# Patient Record
Sex: Male | Born: 1965
Health system: Southern US, Community
[De-identification: ages and names within clinical notes are randomized; demographics above are authoritative.]

## PROBLEM LIST (undated history)

## (undated) DIAGNOSIS — S060XAA Concussion with loss of consciousness status unknown, initial encounter: Secondary | ICD-10-CM

## (undated) DIAGNOSIS — G459 Transient cerebral ischemic attack, unspecified: Secondary | ICD-10-CM

## (undated) DIAGNOSIS — M1990 Unspecified osteoarthritis, unspecified site: Secondary | ICD-10-CM

## (undated) DIAGNOSIS — Z87442 Personal history of urinary calculi: Secondary | ICD-10-CM

## (undated) DIAGNOSIS — F32A Depression, unspecified: Secondary | ICD-10-CM

## (undated) DIAGNOSIS — F419 Anxiety disorder, unspecified: Secondary | ICD-10-CM

## (undated) HISTORY — DX: Transient cerebral ischemic attack, unspecified: G45.9

## (undated) HISTORY — DX: Concussion with loss of consciousness status unknown, initial encounter: S06.0XAA

## (undated) HISTORY — PX: TONSILLECTOMY: SUR1361

---

## 2014-10-02 ENCOUNTER — Emergency Department (HOSPITAL_COMMUNITY)
Admission: EM | Admit: 2014-10-02 | Discharge: 2014-10-02 | Disposition: A | Discharge status: Home / Self Care | Payer: 59 | Source: Home / Self Care | Attending: Family Medicine | Admitting: Family Medicine

## 2014-10-02 ENCOUNTER — Emergency Department (INDEPENDENT_AMBULATORY_CARE_PROVIDER_SITE_OTHER): Payer: 59

## 2014-10-02 ENCOUNTER — Encounter (HOSPITAL_COMMUNITY): Payer: Self-pay | Admitting: Emergency Medicine

## 2014-10-02 DIAGNOSIS — S53031A Nursemaid's elbow, right elbow, initial encounter: Secondary | ICD-10-CM

## 2014-10-02 MED ORDER — HYDROCODONE-ACETAMINOPHEN 5-325 MG PO TABS: 1.0000 | ORAL_TABLET | Freq: Four times a day (QID) | ORAL | Status: DC | PRN

## 2014-10-02 NOTE — ED Notes (Signed)
48 year old male.  Presents to Urgent care ambulatory with his 57 year old daughter.  States that he fell fell approximately  8-10 feet off off roof at his house approximately 1 hour ago.  No LOC.   His only complaints are a bloody nose, a very tender right elbow, and soreness  To left shoulder.

## 2014-10-02 NOTE — Discharge Instructions (Signed)
Elbow Subluxation Elbow subluxation is a partial or minor dislocation of the elbow. This is the most common elbow injury in children up to age 47. This injury happens most often when the child's hand is pulled too hard or when the child is lifted or swung around. It has been called "Nurse Maid's Elbow" labeled after the action of lifting a child off the ground by pulling up on the child's arm. Immediately after such an injury, the child will usually be fussy, but the pain may not last for long. Until the dislocation is corrected, the child may hold the affected arm by their side and refuse to reach out or use it. Elbow x-rays are normal in this condition and are usually not needed to make the diagnosis. The treatment of elbow subluxation is a simple maneuver. It moves the head of the radius bone back to its normal position. The child is usually willing to use the arm within minutes after this procedure. Have your child rest the injured arm for the next 2 days. Use a sling if this makes your child feel better. Avoid pulling the affected arm again, especially over the next 2 weeks. Follow-up with your caregiver as recommended.  SEEK MEDICAL CARE IF:   Your child continues to protect the affected arm.  Lacks full motion of the elbow within 2-3 days.  Complains of persistent pain. Document Released: 01/10/2005 Document Revised: 02/25/2012 Document Reviewed: 03/23/2009 [facility] Patient Information 2015 [place], [place]. This information is not intended to replace advice given to you by your health care provider. Make sure you discuss any questions you have with your health care provider.

## 2014-10-02 NOTE — ED Provider Notes (Signed)
CSN: [id number]     Arrival date & time 10/02/14  1511 History   First MD Initiated Contact with Patient 10/02/14 1553     Chief Complaint  Patient presents with  . Fall   (Consider location/radiation/quality/duration/timing/severity/associated sxs/prior Treatment) HPI      48 year old male presents for evaluation of injury to his right elbow after a fall. Earlier today he slid off his roof and fell 8 feet to his porch. This occurred about 3 hours prior to arrival. He had some immediate pain in his face associated with a nosebleed. This has stopped with direct pressure and he no longer has any pain in his face, although he does have swelling to the right side of his nose. He is able to breathe through his nostrils without difficulty. He also has experienced pain most significantly in his right elbow but also in his right knee and left shoulder. He is able to move the right knee and left shoulder completely without any difficulty and is able to able he without difficulty. He is most concerned about the right elbow. He has severely limited range of motion and pain with any attempted range of motion. There is no swelling around the elbow as well. No numbness or tingling in any of the extremities. No headache, NVD, blurred or double vision, chest pain, difficulty breathing.  No past medical history on file. No past surgical history on file. No family history on file. History  Substance Use Topics  . Smoking status: Not on file  . Smokeless tobacco: Not on file  . Alcohol Use: Not on file    Review of Systems  Constitutional: Negative for fever, chills and fatigue.  HENT: Negative for sore throat.        See history of present illness regarding facial trauma  Eyes: Negative for visual disturbance.  Respiratory: Negative for cough, chest tightness and shortness of breath.   Cardiovascular: Negative for chest pain, palpitations and leg swelling.  Gastrointestinal: Negative for nausea, vomiting,  abdominal pain, diarrhea and constipation.  Genitourinary: Negative for dysuria, urgency, frequency and hematuria.  Musculoskeletal: Positive for arthralgias (see HPI) and back pain (chronic). Negative for myalgias, neck pain and neck stiffness.  Skin: Negative for rash.  Neurological: Negative for dizziness, speech difficulty, weakness, light-headedness, numbness and headaches.  All other systems reviewed and are negative.   Allergies  Review of patient's allergies indicates no known allergies.  Home Medications   Prior to Admission medications   Medication Sig Start Date End Date Taking? Authorizing Provider  FLUoxetine (PROZAC) 40 MG capsule Take 40 mg by mouth daily.   Yes Historical Provider, MD  testosterone (ANDROGEL) 50 MG/5GM (1%) GEL Place 5 g onto the skin daily.   Yes Historical Provider, MD  HYDROcodone-acetaminophen (NORCO) 5-325 MG per tablet Take 1-2 tablets by mouth every 6 (six) hours as needed for moderate pain. 10/02/14   Freeman Caldron Tadhg Eskew, PA-C   BP 135/89  Pulse 96  Temp(Src) 99.2 F (37.3 C) (Oral)  Resp 16  SpO2 97% Physical Exam  Nursing note and vitals reviewed. Constitutional: He is oriented to person, place, and time. He appears well-developed and well-nourished. No distress.  HENT:  Head: Normocephalic. Head is with abrasion (abrasion ofthe right cheek with slight swelling over the right side of nose).  Nose: No sinus tenderness. Right sinus exhibits no maxillary sinus tenderness and no frontal sinus tenderness. Left sinus exhibits no maxillary sinus tenderness and no frontal sinus tenderness.  Nares are patent  Eyes: Conjunctivae and EOM are normal. Pupils are equal, round, and reactive to light.  No orbital tenderness  Neck: Full passive range of motion without pain. Neck supple.  Pulmonary/Chest: Effort normal. No respiratory distress.  Musculoskeletal:       Left shoulder: He exhibits tenderness (minimal tenderness around the junction of the sternum  and the third rib). He exhibits no swelling and no deformity.       Right elbow: He exhibits decreased range of motion (Decreased supination , extension, and flexion). He exhibits no swelling, no effusion, no deformity and no laceration. Tenderness found. Radial head tenderness noted. No medial epicondyle, no lateral epicondyle and no olecranon process tenderness noted.       Right knee: He exhibits erythema (Abrasion on the anterior portion of the right knee). He exhibits normal range of motion, no swelling, no effusion and no deformity. No tenderness found.       Cervical back: Normal.       Thoracic back: Normal.       Lumbar back: Normal.  Neurological: He is alert and oriented to person, place, and time. He has normal strength. No cranial nerve deficit or sensory deficit. He exhibits normal muscle tone. Coordination and gait normal. GCS eye subscore is 4. GCS verbal subscore is 5. GCS motor subscore is 6.  Skin: Skin is warm and dry. No rash noted. He is not diaphoretic.  Psychiatric: He has a normal mood and affect. Judgment normal.    ED Course  Reduction of dislocation Date/Time: 10/02/2014 5:35 PM Performed by: Allena Katz, H Authorized by: Ihor Gully D Consent: Verbal consent obtained. Risks and benefits: risks, benefits and alternatives were discussed Consent given by: patient Patient identity confirmed: verbally with patient Time out: Immediately prior to procedure a "time out" was called to verify the correct patient, procedure, equipment, support staff and site/side marked as required. Patient sedated: no Patient tolerance: Patient tolerated the procedure well with no immediate complications. Comments: Radial head subluxation, reduced with traction/supination and massage the radial head   (including critical care time) Labs Review Labs Reviewed - No data to display  Imaging Review Dg Elbow Complete Right  10/02/2014   CLINICAL DATA:  Status post fall from roof at  13:30 hr today. Right elbow pain.  EXAM: RIGHT ELBOW - COMPLETE 3+ VIEW  COMPARISON:  None.  FINDINGS: The patient refused to complete the exam. No fracture, dislocation or joint effusion is identified on images provided.  IMPRESSION: No acute abnormality.   Electronically Signed   By: Inge Rise M.D.   On: 10/02/2014 17:01     MDM   1. Nursemaid's elbow, right, initial encounter    Reduced successfully with near full resolution of symptoms. Ice, anti-inflammatory, Norco when necessary. Followup with ortho this week    New Prescriptions   HYDROCODONE-ACETAMINOPHEN (NORCO) 5-325 MG PER TABLET    Take 1-2 tablets by mouth every 6 (six) hours as needed for moderate pain.       Liam Graham, PA-C 10/02/14 [place] Annebelle Bostic, PA-C 10/03/14 [phone]

## 2014-10-06 NOTE — ED Provider Notes (Signed)
Medical screening examination/treatment/procedure(s) were performed by resident physician or non-physician practitioner and as supervising physician I was immediately available for consultation/collaboration.   Pauline Good MD.   Billy Fischer, MD 10/06/14 2030

## 2016-01-03 MED FILL — FLUoxetine HCL 20 MG CAPS: 20 | 90 days supply | Qty: 90 | Fill #0

## 2016-01-03 MED FILL — TRAMADOL-ACETAMINOPHN 37.5-: 37.5-325 | 45 days supply | Qty: 90 | Fill #0

## 2016-01-03 MED FILL — MELOXICAM 15 MG TABLET: 15 | 90 days supply | Qty: 90 | Fill #2

## 2016-01-04 MED FILL — ANDROGEL 1.62% GEL PUMP: 20.25 MG/AC | 84 days supply | Qty: 525 | Fill #0

## 2016-02-06 DIAGNOSIS — J40 Bronchitis, not specified as acute or chronic: Secondary | ICD-10-CM | POA: Diagnosis not present

## 2016-02-06 DIAGNOSIS — J329 Chronic sinusitis, unspecified: Secondary | ICD-10-CM | POA: Diagnosis not present

## 2016-02-21 DIAGNOSIS — R05 Cough: Secondary | ICD-10-CM | POA: Diagnosis not present

## 2016-02-21 DIAGNOSIS — J3489 Other specified disorders of nose and nasal sinuses: Secondary | ICD-10-CM | POA: Diagnosis not present

## 2016-02-21 MED FILL — BENZONATATE 200 MG CAPSULE: 200 | 14 days supply | Qty: 28 | Fill #0

## 2016-02-21 MED FILL — FLUTICASONE PROP 50 MCG SPR: 50 | 30 days supply | Qty: 16 | Fill #0

## 2016-02-28 DIAGNOSIS — H5203 Hypermetropia, bilateral: Secondary | ICD-10-CM | POA: Diagnosis not present

## 2016-04-10 DIAGNOSIS — D1801 Hemangioma of skin and subcutaneous tissue: Secondary | ICD-10-CM | POA: Diagnosis not present

## 2016-04-10 DIAGNOSIS — L578 Other skin changes due to chronic exposure to nonionizing radiation: Secondary | ICD-10-CM | POA: Diagnosis not present

## 2016-04-10 DIAGNOSIS — L821 Other seborrheic keratosis: Secondary | ICD-10-CM | POA: Diagnosis not present

## 2016-04-17 MED FILL — FLUoxetine HCL 20 MG CAPS: 20 | 90 days supply | Qty: 90 | Fill #1

## 2016-04-17 MED FILL — MELOXICAM 15 MG TABLET: 15 | 90 days supply | Qty: 90 | Fill #3

## 2016-04-17 MED FILL — TRAMADOL-ACETAMINOPHN 37.5-: 37.5-325 | 45 days supply | Qty: 90 | Fill #1

## 2016-04-24 MED FILL — ANDROGEL 1.62% GEL PUMP: 20.25 MG/AC | 84 days supply | Qty: 525 | Fill #1

## 2016-04-27 DIAGNOSIS — H8111 Benign paroxysmal vertigo, right ear: Secondary | ICD-10-CM | POA: Diagnosis not present

## 2016-07-02 DIAGNOSIS — S43421A Sprain of right rotator cuff capsule, initial encounter: Secondary | ICD-10-CM | POA: Diagnosis not present

## 2016-07-02 MED FILL — predniSONE 10 MG (48) TBPK: 10 | 12 days supply | Qty: 48 | Fill #0

## 2016-07-16 MED FILL — FLUoxetine HCL 20 MG CAPS: 20 | 90 days supply | Qty: 90 | Fill #2

## 2016-07-16 MED FILL — MELOXICAM 15 MG TABLET: 15 | 90 days supply | Qty: 90 | Fill #0

## 2016-08-06 DIAGNOSIS — E291 Testicular hypofunction: Secondary | ICD-10-CM | POA: Diagnosis not present

## 2016-08-06 DIAGNOSIS — E782 Mixed hyperlipidemia: Secondary | ICD-10-CM | POA: Diagnosis not present

## 2016-08-06 DIAGNOSIS — S43421A Sprain of right rotator cuff capsule, initial encounter: Secondary | ICD-10-CM | POA: Diagnosis not present

## 2016-08-06 DIAGNOSIS — F341 Dysthymic disorder: Secondary | ICD-10-CM | POA: Diagnosis not present

## 2016-08-06 MED FILL — TRAMADOL-ACETAMINOPHN 37.5-: 37.5-325 | 45 days supply | Qty: 90 | Fill #0

## 2016-08-06 MED FILL — CYCLOBENZAPRINE 5 MG TABLET: 5 | 10 days supply | Qty: 30 | Fill #0

## 2016-08-13 MED FILL — ANDROGEL 1.62% GEL PUMP: 20.25 MG/AC | 84 days supply | Qty: 525 | Fill #0

## 2016-10-10 MED FILL — CYCLOBENZAPRINE 5 MG TABLET: 5 | 10 days supply | Qty: 30 | Fill #1

## 2016-10-10 MED FILL — FLUoxetine HCL 20 MG CAPS: 20 | 90 days supply | Qty: 90 | Fill #0

## 2016-10-10 MED FILL — MELOXICAM 15 MG TABLET: 15 | 90 days supply | Qty: 90 | Fill #1

## 2016-10-10 MED FILL — TRAMADOL-ACETAMINOPHN 37.5-: 37.5-325 | 45 days supply | Qty: 90 | Fill #1

## 2016-10-15 DIAGNOSIS — S43421A Sprain of right rotator cuff capsule, initial encounter: Secondary | ICD-10-CM | POA: Diagnosis not present

## 2016-10-15 MED FILL — PENNSAID 2% PUMP: 2 | 30 days supply | Qty: 112 | Fill #0

## 2016-11-20 DIAGNOSIS — Z0000 Encounter for general adult medical examination without abnormal findings: Secondary | ICD-10-CM | POA: Diagnosis not present

## 2016-11-20 MED FILL — PENNSAID 2% PUMP: 2 | 90 days supply | Qty: 224 | Fill #0

## 2016-11-20 MED FILL — CEFDINIR 300 MG CAPSULE: 300 | 7 days supply | Qty: 14 | Fill #0

## 2016-12-20 MED FILL — ANDROGEL 1.62% GEL PUMP: 20.25 MG/AC | 84 days supply | Qty: 525 | Fill #1

## 2016-12-21 DIAGNOSIS — J189 Pneumonia, unspecified organism: Secondary | ICD-10-CM | POA: Diagnosis not present

## 2017-01-21 MED FILL — FLUoxetine HCL 20 MG CAPS: 20 | 90 days supply | Qty: 90 | Fill #1

## 2017-01-29 DIAGNOSIS — M7581 Other shoulder lesions, right shoulder: Secondary | ICD-10-CM | POA: Diagnosis not present

## 2017-01-29 MED FILL — MELOXICAM 15 MG TABLET: 15 | 90 days supply | Qty: 90 | Fill #0

## 2017-01-29 MED FILL — TRAMADOL-ACETAMINOPHN 37.5-: 37.5-325 | 45 days supply | Qty: 90 | Fill #0

## 2017-04-22 MED FILL — TRAMADOL-ACETAMINOPHN 37.5-: 37.5-325 | 45 days supply | Qty: 90 | Fill #1

## 2017-04-22 MED FILL — MELOXICAM 15 MG TABLET: 15 | 90 days supply | Qty: 90 | Fill #1

## 2017-04-22 MED FILL — FLUoxetine HCL 20 MG CAPS: 20 | 90 days supply | Qty: 90 | Fill #2

## 2017-04-25 MED FILL — DICLOFENAC SODIUM 1% GEL: 1 | 12 days supply | Qty: 200 | Fill #0

## 2017-04-26 MED FILL — ANDROGEL 1.62% GEL PUMP: 20.25 MG/AC | 84 days supply | Qty: 525 | Fill #0

## 2017-05-07 DIAGNOSIS — M7581 Other shoulder lesions, right shoulder: Secondary | ICD-10-CM | POA: Diagnosis not present

## 2017-05-07 DIAGNOSIS — L089 Local infection of the skin and subcutaneous tissue, unspecified: Secondary | ICD-10-CM | POA: Diagnosis not present

## 2017-05-07 DIAGNOSIS — Z1389 Encounter for screening for other disorder: Secondary | ICD-10-CM | POA: Diagnosis not present

## 2017-05-07 MED FILL — DICLOFENAC SODIUM 1% GEL: 1 | 87 days supply | Qty: 1400 | Fill #0

## 2017-05-09 MED FILL — MUPIROCIN 2% OINTMENT: 2 | 20 days supply | Qty: 22 | Fill #0

## 2017-06-25 DIAGNOSIS — Z6831 Body mass index (BMI) 31.0-31.9, adult: Secondary | ICD-10-CM | POA: Diagnosis not present

## 2017-06-25 DIAGNOSIS — L089 Local infection of the skin and subcutaneous tissue, unspecified: Secondary | ICD-10-CM | POA: Diagnosis not present

## 2017-06-25 DIAGNOSIS — M25571 Pain in right ankle and joints of right foot: Secondary | ICD-10-CM | POA: Diagnosis not present

## 2017-06-25 MED FILL — CEPHALEXIN 500 MG CAPSULE: 500 | 5 days supply | Qty: 10 | Fill #0

## 2017-06-25 MED FILL — MUPIROCIN 2% OINTMENT: 2 | 20 days supply | Qty: 22 | Fill #1

## 2017-06-25 MED FILL — TRAMADOL-ACETAMINOPHN 37.5-: 37.5-325 | 45 days supply | Qty: 90 | Fill #0

## 2017-07-22 MED FILL — ANDROGEL 1.62% GEL PUMP: 20.25 MG/AC | 84 days supply | Qty: 525 | Fill #1

## 2017-07-22 MED FILL — MELOXICAM 15 MG TABLET: 15 | 90 days supply | Qty: 90 | Fill #2

## 2017-07-22 MED FILL — MUPIROCIN 2% OINTMENT: 2 | 20 days supply | Qty: 22 | Fill #2

## 2017-07-22 MED FILL — FLUoxetine HCL 20 MG CAPS: 20 | 90 days supply | Qty: 90 | Fill #0

## 2017-08-05 DIAGNOSIS — H524 Presbyopia: Secondary | ICD-10-CM | POA: Diagnosis not present

## 2017-08-05 DIAGNOSIS — H5203 Hypermetropia, bilateral: Secondary | ICD-10-CM | POA: Diagnosis not present

## 2017-08-15 ENCOUNTER — Other Ambulatory Visit (HOSPITAL_COMMUNITY): Payer: Self-pay | Admitting: Family Medicine

## 2017-08-15 DIAGNOSIS — M7581 Other shoulder lesions, right shoulder: Principal | ICD-10-CM

## 2017-08-15 DIAGNOSIS — M778 Other enthesopathies, not elsewhere classified: Secondary | ICD-10-CM

## 2017-08-26 ENCOUNTER — Ambulatory Visit (HOSPITAL_COMMUNITY)
Admission: RE | Admit: 2017-08-26 | Discharge: 2017-08-26 | Disposition: A | Discharge status: Home / Self Care | Payer: 59 | Source: Ambulatory Visit | Attending: Family Medicine | Admitting: Family Medicine

## 2017-08-26 ENCOUNTER — Encounter (HOSPITAL_COMMUNITY): Payer: Self-pay

## 2017-08-26 DIAGNOSIS — M7581 Other shoulder lesions, right shoulder: Principal | ICD-10-CM

## 2017-08-26 DIAGNOSIS — M778 Other enthesopathies, not elsewhere classified: Secondary | ICD-10-CM

## 2017-09-05 MED FILL — PERMETHRIN 5% CREAM: 5 | 1 days supply | Qty: 60 | Fill #0

## 2017-09-23 DIAGNOSIS — Z23 Encounter for immunization: Secondary | ICD-10-CM | POA: Diagnosis not present

## 2017-10-21 MED FILL — TRAMADOL-ACETAMINOPHN 37.5-: 37.5-325 | 45 days supply | Qty: 90 | Fill #1

## 2017-10-21 MED FILL — MELOXICAM 15 MG TABLET: 15 | 90 days supply | Qty: 90 | Fill #0

## 2017-10-21 MED FILL — FLUoxetine HCL 20 MG CAPS: 20 | 90 days supply | Qty: 90 | Fill #1

## 2017-10-30 MED FILL — TESTOSTERONE 20.25 MG/ACT (: 20.25 MG/AC | 90 days supply | Qty: 450 | Fill #0

## 2017-11-05 DIAGNOSIS — E291 Testicular hypofunction: Secondary | ICD-10-CM | POA: Diagnosis not present

## 2017-11-05 DIAGNOSIS — Z6832 Body mass index (BMI) 32.0-32.9, adult: Secondary | ICD-10-CM | POA: Diagnosis not present

## 2017-11-05 DIAGNOSIS — M7581 Other shoulder lesions, right shoulder: Secondary | ICD-10-CM | POA: Diagnosis not present

## 2017-12-17 HISTORY — PX: SHOULDER ARTHROSCOPY W/ ROTATOR CUFF REPAIR: SHX2400

## 2018-01-20 MED FILL — MELOXICAM 15 MG TABLET: 15 | 90 days supply | Qty: 90 | Fill #1

## 2018-01-20 MED FILL — FLUoxetine HCL 20 MG CAPS: 20 | 90 days supply | Qty: 90 | Fill #2

## 2018-01-22 MED FILL — TRAMADOL-ACETAMINOPHN 37.5-: 37.5-325 | 45 days supply | Qty: 90 | Fill #0

## 2018-02-10 DIAGNOSIS — M67911 Unspecified disorder of synovium and tendon, right shoulder: Secondary | ICD-10-CM | POA: Diagnosis not present

## 2018-02-10 DIAGNOSIS — M67912 Unspecified disorder of synovium and tendon, left shoulder: Secondary | ICD-10-CM | POA: Diagnosis not present

## 2018-02-10 MED FILL — diazePAM 5 MG TABS: 5 | 2 days supply | Qty: 2 | Fill #0

## 2018-02-11 ENCOUNTER — Other Ambulatory Visit: Payer: Self-pay | Admitting: Orthopedic Surgery

## 2018-02-11 DIAGNOSIS — L820 Inflamed seborrheic keratosis: Secondary | ICD-10-CM | POA: Diagnosis not present

## 2018-02-11 DIAGNOSIS — M67911 Unspecified disorder of synovium and tendon, right shoulder: Secondary | ICD-10-CM

## 2018-02-11 DIAGNOSIS — M7581 Other shoulder lesions, right shoulder: Secondary | ICD-10-CM | POA: Diagnosis not present

## 2018-02-11 DIAGNOSIS — E291 Testicular hypofunction: Secondary | ICD-10-CM | POA: Diagnosis not present

## 2018-02-11 DIAGNOSIS — Z79899 Other long term (current) drug therapy: Secondary | ICD-10-CM | POA: Diagnosis not present

## 2018-02-11 DIAGNOSIS — M67912 Unspecified disorder of synovium and tendon, left shoulder: Principal | ICD-10-CM

## 2018-02-11 DIAGNOSIS — Z125 Encounter for screening for malignant neoplasm of prostate: Secondary | ICD-10-CM | POA: Diagnosis not present

## 2018-02-11 MED FILL — TAMSULOSIN HCL 0.4 MG CAP: 0.4 | 90 days supply | Qty: 90 | Fill #0

## 2018-02-11 MED FILL — CELECOXIB 200 MG CAPSULE: 200 | 90 days supply | Qty: 180 | Fill #0

## 2018-02-24 ENCOUNTER — Ambulatory Visit
Admission: RE | Admit: 2018-02-24 | Discharge: 2018-02-24 | Disposition: A | Discharge status: Home / Self Care | Payer: 59 | Source: Ambulatory Visit | Attending: Orthopedic Surgery | Admitting: Orthopedic Surgery

## 2018-02-24 ENCOUNTER — Other Ambulatory Visit: Payer: Self-pay | Admitting: Diagnostic Radiology

## 2018-02-24 DIAGNOSIS — M67912 Unspecified disorder of synovium and tendon, left shoulder: Principal | ICD-10-CM

## 2018-02-24 DIAGNOSIS — S0550XA Penetrating wound with foreign body of unspecified eyeball, initial encounter: Secondary | ICD-10-CM

## 2018-02-24 DIAGNOSIS — M67911 Unspecified disorder of synovium and tendon, right shoulder: Secondary | ICD-10-CM

## 2018-02-24 DIAGNOSIS — M19011 Primary osteoarthritis, right shoulder: Secondary | ICD-10-CM | POA: Diagnosis not present

## 2018-02-24 MED FILL — TESTOSTERONE 12.5 MG/1.25 G: 12.5 MG/ACT | 72 days supply | Qty: 450 | Fill #0

## 2018-03-03 DIAGNOSIS — M75111 Incomplete rotator cuff tear or rupture of right shoulder, not specified as traumatic: Secondary | ICD-10-CM | POA: Diagnosis not present

## 2018-04-01 DIAGNOSIS — M7542 Impingement syndrome of left shoulder: Secondary | ICD-10-CM | POA: Diagnosis not present

## 2018-04-01 DIAGNOSIS — J029 Acute pharyngitis, unspecified: Secondary | ICD-10-CM | POA: Diagnosis not present

## 2018-04-01 DIAGNOSIS — M7712 Lateral epicondylitis, left elbow: Secondary | ICD-10-CM | POA: Diagnosis not present

## 2018-04-17 MED FILL — FLUoxetine HCL 20 MG CAPS: 20 | 90 days supply | Qty: 90 | Fill #0

## 2018-04-17 MED FILL — TRAMADOL-ACETAMINOPHN 37.5-: 37.5-325 | 45 days supply | Qty: 90 | Fill #1

## 2018-05-14 MED FILL — CELECOXIB 200 MG CAPSULE: 200 | 90 days supply | Qty: 180 | Fill #1

## 2018-05-15 MED FILL — TESTOSTERONE 20.25 MG/ACT (: 20.25 MG/AC | 84 days supply | Qty: 525 | Fill #0

## 2018-05-19 ENCOUNTER — Ambulatory Visit
Admission: RE | Admit: 2018-05-19 | Discharge: 2018-05-19 | Disposition: A | Discharge status: Home / Self Care | Payer: 59 | Source: Ambulatory Visit | Attending: Orthopedic Surgery | Admitting: Orthopedic Surgery

## 2018-05-19 ENCOUNTER — Other Ambulatory Visit: Payer: Self-pay | Admitting: Orthopedic Surgery

## 2018-05-19 DIAGNOSIS — M75100 Unspecified rotator cuff tear or rupture of unspecified shoulder, not specified as traumatic: Secondary | ICD-10-CM

## 2018-05-19 DIAGNOSIS — M67912 Unspecified disorder of synovium and tendon, left shoulder: Secondary | ICD-10-CM

## 2018-05-19 DIAGNOSIS — M25512 Pain in left shoulder: Secondary | ICD-10-CM | POA: Diagnosis not present

## 2018-05-19 DIAGNOSIS — M545 Low back pain: Secondary | ICD-10-CM | POA: Diagnosis not present

## 2018-05-20 ENCOUNTER — Other Ambulatory Visit: Payer: Self-pay | Admitting: Orthopedic Surgery

## 2018-05-20 DIAGNOSIS — M67912 Unspecified disorder of synovium and tendon, left shoulder: Secondary | ICD-10-CM

## 2018-05-21 ENCOUNTER — Other Ambulatory Visit: Payer: 59

## 2018-06-09 DIAGNOSIS — Z1339 Encounter for screening examination for other mental health and behavioral disorders: Secondary | ICD-10-CM | POA: Diagnosis not present

## 2018-06-09 DIAGNOSIS — Z0000 Encounter for general adult medical examination without abnormal findings: Secondary | ICD-10-CM | POA: Diagnosis not present

## 2018-06-09 DIAGNOSIS — Z1331 Encounter for screening for depression: Secondary | ICD-10-CM | POA: Diagnosis not present

## 2018-06-09 DIAGNOSIS — Z6831 Body mass index (BMI) 31.0-31.9, adult: Secondary | ICD-10-CM | POA: Diagnosis not present

## 2018-06-09 MED FILL — SILDENAFIL CITRATE 100 MG T: 100 | 30 days supply | Qty: 6 | Fill #0

## 2018-06-10 DIAGNOSIS — M7542 Impingement syndrome of left shoulder: Secondary | ICD-10-CM | POA: Diagnosis not present

## 2018-06-10 DIAGNOSIS — G8918 Other acute postprocedural pain: Secondary | ICD-10-CM | POA: Diagnosis not present

## 2018-06-10 DIAGNOSIS — M75112 Incomplete rotator cuff tear or rupture of left shoulder, not specified as traumatic: Secondary | ICD-10-CM | POA: Diagnosis not present

## 2018-06-10 MED FILL — HYDROCODON-APAP 5-325: 5-325 | 3 days supply | Qty: 30 | Fill #0

## 2018-06-10 MED FILL — METHOCARBAMOL 500 MG TABLET: 500 | 10 days supply | Qty: 30 | Fill #0

## 2018-06-23 DIAGNOSIS — Z9889 Other specified postprocedural states: Secondary | ICD-10-CM | POA: Diagnosis not present

## 2018-06-26 ENCOUNTER — Other Ambulatory Visit: Payer: Self-pay

## 2018-06-26 ENCOUNTER — Ambulatory Visit: Payer: 59 | Attending: Orthopedic Surgery | Admitting: Physical Therapy

## 2018-06-26 ENCOUNTER — Encounter: Payer: Self-pay | Admitting: Physical Therapy

## 2018-06-26 DIAGNOSIS — R293 Abnormal posture: Secondary | ICD-10-CM | POA: Diagnosis not present

## 2018-06-26 DIAGNOSIS — M25512 Pain in left shoulder: Secondary | ICD-10-CM | POA: Insufficient documentation

## 2018-06-26 DIAGNOSIS — M25612 Stiffness of left shoulder, not elsewhere classified: Secondary | ICD-10-CM | POA: Diagnosis not present

## 2018-06-26 NOTE — Therapy (Signed)
[place], [place], [address] Phone: [phone]   Fax:  [phone]  Physical Therapy Evaluation  Patient Details  Name: Matthew Mooney. MRN: [id number] Date of Birth: [birth year] Referring Provider: Isabella Stalling    Encounter Date: 06/26/2018  PT End of Session - 06/26/18 1207    Visit Number  1    Number of Visits  16    Date for PT Re-Evaluation  08/21/18    Authorization Type  [facility]    PT Start Time  0931    PT Stop Time  1015    PT Time Calculation (min)  44 min    Activity Tolerance  Patient tolerated treatment well    Behavior During Therapy  [facility] for tasks assessed/performed       History reviewed. No pertinent past medical history.  History reviewed. No pertinent surgical history.  There were no vitals filed for this visit.   Subjective Assessment - 06/26/18 0940    Subjective  Had surgery on 06-10-18.  for RTC repair. Pt reports 99% tear in left shoulder.  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  Yes    Pain Score  2     Pain Orientation  Left    Pain Descriptors / Indicators  Aching;Sore    Pain Type  Surgical pain    Pain Onset  1 to 4 weeks ago    Aggravating Factors   raising arm over 90 degrees.      Pain Relieving Factors  ice, medication,           [facility] PT Assessment - 06/26/18 0940      Assessment   Medical Diagnosis  rotator cuff repair  left supraspinatus    Referring Provider  Isabella Stalling.     Onset Date/Surgical Date  06/10/18 RTC repair    Hand Dominance  Left    Next MD Visit  5 weeks    Prior Therapy  none      Precautions   Precautions  Shoulder    Type of Shoulder  Precautions  PROM protocol flex to 90, ER to 20 IR to abdomen       Restrictions   Weight Bearing Restrictions  No      Balance Screen   Has the patient fallen in the past 6 months  No    Has the patient had a decrease in activity level because of a fear of falling?   No    Is the patient reluctant to leave their home because of a fear of falling?   No      Prior Function   Level of Independence  Independent    Vocation  Full time employment    Energy manager      Cognition   Overall Cognitive Status  Within Functional Limits for tasks assessed      Observation/Other Assessments   Focus on Therapeutic Outcomes (FOTO)   FOTO intake 56% limitation 44%  predicted 27%      Sensation   Light Touch  Appears Intact  Hot/Cold  Appears Intact    Proprioception  Appears Intact      Posture/Postural Control   Posture/Postural Control  Postural limitations    Postural Limitations  Rounded Shoulders;Forward head      ROM / Strength   AROM / PROM / Strength  AROM;Strength      AROM   Overall AROM   Deficits    Right Shoulder Flexion  149 Degrees    Right Shoulder ABduction  144 Degrees    Right Shoulder Internal Rotation  60 Degrees    Right Shoulder External Rotation  80 Degrees    Left Shoulder Extension  15 Degrees    Left Shoulder Flexion  100 Degrees Pt comes to clinic lifting arm to 90/ ordersare PROM    Left Shoulder ABduction  75 Degrees    Left Shoulder Internal Rotation  40 Degrees    Left Shoulder External Rotation  20 Degrees      Strength   Overall Strength  Deficits    Right Shoulder Flexion  5/5    Right Shoulder Extension  5/5    Right Shoulder ABduction  5/5    Right Shoulder Internal Rotation  5/5    Right Shoulder External Rotation  5/5    Left Shoulder Flexion  3-/5    Left Shoulder Extension  3-/5    Left Shoulder ABduction  3-/5    Left Shoulder Internal Rotation  3-/5    Left Shoulder External Rotation  3-/5      Palpation    Palpation comment  tenderness over supraspinatus fossa and over arthroscopy sites                Objective measurements completed on examination: See above findings.      [facility] Adult PT Treatment/Exercise - 06/26/18 0001      Self-Care   Self-Care  Heat/Ice Application;Other Self-Care Comments    Heat/Ice Application  cryotherapy    Other Self-Care Comments   care of rotator cuff and imrportance of sticking with protocol  initial sitting and standing posture and use of towel at night for bed and positioning for comfort      Shoulder Exercises: Seated   Other Seated Exercises  table flexion, x 10 and elbow resting on table for elbow flexion x 10 and ball squeeze x 10      Shoulder Exercises: Standing   Other Standing Exercises  pendulum forward, side to side and clockwise and counter clockwise             PT Education - 06/26/18 1015    Education Details  POC Explanation of findings    Person(s) Educated  Patient    Methods  Explanation;Demonstration;Tactile cues;Verbal cues;Handout    Comprehension  Verbal cues required;Returned demonstration;Verbalized understanding       PT Short Term Goals - 06/26/18 1201      PT SHORT TERM GOAL #1   Title  "Independent with initial HEP    Time  4    Period  Weeks    Status  New    Target Date  07/24/18      PT SHORT TERM GOAL #2   Title  "Demonstrate and verbalize understanding of condition management including RICE, positioning, HEP.     Time  4    Period  Weeks    Status  New    Target Date  07/24/18      PT SHORT TERM GOAL #3   Title  Pt will be  able to perform AROM as per protocol within pain freee range    Time  4    Period  Weeks    Target Date  07/24/18      PT SHORT TERM GOAL #4   Title  "Demonstrate understanding of proper sitting posture, body mechanics, work ergonomics, and be more conscious of position and posture throughout the day.     Time  4    Period  Weeks    Status  New    Target Date   07/24/18        PT Long Term Goals - 06/26/18 1203      PT LONG TERM GOAL #1   Title  "Pt will be independent with advanced HEP.     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     Time  8    Period  Weeks    Status  New    Target Date  08/21/18      PT LONG TERM GOAL #4   Title  Left  shoulder IR and ER will return to [facility] to return to pain-free ADLs such as dressing and grooming.     Time  8    Period  Weeks    Status  New    Target Date  08/21/18      PT LONG TERM GOAL #5   Title  "FOTO will improve from  44% limtation   to  27 % limitation   indicating improved functional mobility .     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 . Pt presents with impairments including mild pain, limited ROM, weakness, and as a result, pt is limited with ADLs and functional activities. Pt would benefit from skilled outpatient PT services for 2 times a week for 8 weeks to progress toward pain-free PLOF. and return to work as a Art gallery manager    Clinical Presentation  Stable    Clinical Decision Making  Low    Rehab Potential  Excellent    PT Frequency  2x / week    PT Duration  8 weeks    PT Treatment/Interventions  Dry needling;Cryotherapy;Electrical Stimulation;Iontophoresis 4mg /ml Dexamethasone;Moist Heat;Ultrasound;Therapeutic exercise;Therapeutic  activities;Neuromuscular re-education;Patient/family education;Manual techniques;Passive range of motion;Taping    PT Next Visit Plan  Review HEP pendulum, perform PROM as per protocol chandler in media section/protocols.  RTC 06-10-18    PT Home Exercise Plan  pendulum, PROM flex to 90, ER 20 IR to abdomen elbow flex, ball squeeze  Consulted and Agree with Plan of Care  Patient       Patient will benefit from skilled therapeutic intervention in order to improve the following deficits and impairments:  Pain, Postural dysfunction, Improper body mechanics, Impaired UE functional use, Decreased strength, Decreased range of motion, Impaired flexibility  Visit Diagnosis: Acute pain of left shoulder  Abnormal posture  Stiffness of left shoulder, not elsewhere classified     Problem List There are no active problems to display for this patient.  Voncille Lo, PT Certified Exercise Expert for the Aging Adult  06/26/18 12:19 PM Phone: [phone] Fax: [facility] [address] [place], [place], [address] Phone: [phone]   Fax:  [phone]  Name: Orvie Caradine. MRN: [id number] Date of Birth: 07/21/1966

## 2018-06-26 NOTE — Patient Instructions (Addendum)
ROM: Pendulum (Circular)  Let right arm move in circle clockwise, then counterclockwise, by rocking body weight in circular pattern. Circle _10___ times each direction per set. Do _3___ sessions per day.  Pendulum Side to Side  Bend forward 90 at waist, leaning on table for support. Rock body from side to side and let arm swing freely. Repeat _10___ times. Do __3__ sessions per day.  Finger Flexors  Keeping right fingertips straight, press putty toward base of palm. Repeat __20__ times. Do __3__ sessions per day. Activity: Squeeze flour sifter, plastic squeeze bottles, [place] baster, juice from fruit.*  AROM: Elbow Flexion / Extension  With left hand palm up, gently bend elbow as far as possible. Then straighten arm as far as possible. Repeat __10__ times per set. Do __3__ sessions per day.  SHOULDER: Flexion On Table  Place hands on table, elbows straight. Move hips away from body. Press hands down into table. Hold _3__ seconds. _10__ reps per set, __3_ sets per day. Posture - Sitting   Sit upright, head facing forward. Try using a roll to support lower back. Keep shoulders relaxed, and avoid rounded back. Keep hips level with knees. Avoid crossing legs for long periods.  Sit on sit bones not tail bone.  Remember ribs lifted up and chin down. Not military  Copyright  VHI. All rights reserved.   Cryotherapy  ICE 20 mins at most, 3 times a day following exercises.   Cryotherapy means treatment with cold. Ice or gel packs can be used to reduce both pain and swelling. Ice is the most helpful within the first 24 to 48 hours after an injury or flare-up from overusing a muscle or joint. Sprains, strains, spasms, burning pain, shooting pain, and aches can all be eased with ice. Ice can also be used when recovering from surgery. Ice is effective, has very few side effects, and is safe for most people to use. PRECAUTIONS  Ice is not a safe treatment option for people with:  Raynaud  phenomenon. This is a condition affecting small blood vessels in the extremities. Exposure to cold may cause your problems to return.  Cold hypersensitivity. There are many forms of cold hypersensitivity, including:  Cold urticaria. Red, itchy hives appear on the skin when the tissues begin to warm after being iced.  Cold erythema. This is a red, itchy rash caused by exposure to cold.  Cold hemoglobinuria. Red blood cells break down when the tissues begin to warm after being iced. The hemoglobin that carry oxygen are passed into the urine because they cannot combine with blood proteins fast enough.  Numbness or altered sensitivity in the area being iced. If you have any of the following conditions, do not use ice until you have discussed cryotherapy with your caregiver:  Heart conditions, such as arrhythmia, angina, or chronic heart disease.  High blood pressure.  Healing wounds or open skin in the area being iced.  Current infections.  Rheumatoid arthritis.  Poor circulation.  Diabetes. Ice slows the blood flow in the region it is applied. This is beneficial when trying to stop inflamed tissues from spreading irritating chemicals to surrounding tissues. However, if you expose your skin to cold temperatures for too long or without the proper protection, you can damage your skin or nerves. Watch for signs of skin damage due to cold. HOME CARE INSTRUCTIONS Follow these tips to use ice and cold packs safely.  Place a dry or damp towel between the ice and skin. A damp towel  will cool the skin more quickly, so you may need to shorten the time that the ice is used.  For a more rapid response, add gentle compression to the ice.  Ice for no more than 20 minutes at a time. The bonier the area you are icing, the less time it will take to get the benefits of ice.  Check your skin after 5 minutes to make sure there are no signs of a poor response to cold or skin damage.  Rest 20 minutes or  more between uses.  Once your skin is numb, you can end your treatment. You can test numbness by very lightly touching your skin. The touch should be so light that you do not see the skin dimple from the pressure of your fingertip. When using ice, most people will feel these normal sensations in this order: cold, burning, aching, and numbness.  Do not use ice on someone who cannot communicate their responses to pain, such as small children or people with dementia. HOW TO MAKE AN ICE PACK Ice packs are the most common way to use ice therapy. Other methods include ice massage, ice baths, and cryosprays. Muscle creams that cause a cold, tingly feeling do not offer the same benefits that ice offers and should not be used as a substitute unless recommended by your caregiver. To make an ice pack, do one of the following:  Place crushed ice or a bag of frozen vegetables in a sealable plastic bag. Squeeze out the excess air. Place this bag inside another plastic bag. Slide the bag into a pillowcase or place a damp towel between your skin and the bag.  Mix 3 parts water with 1 part rubbing alcohol. Freeze the mixture in a sealable plastic bag. When you remove the mixture from the freezer, it will be slushy. Squeeze out the excess air. Place this bag inside another plastic bag. Slide the bag into a pillowcase or place a damp towel between your skin and the bag. SEEK MEDICAL CARE IF:  You develop white spots on your skin. This may give the skin a blotchy (mottled) appearance.  Your skin turns blue or pale.  Your skin becomes waxy or hard.  Your swelling gets worse. MAKE SURE YOU:   Understand these instructions.  Will watch your condition.  Will get help right away if you are not doing well or get worse. Document Released: 07/30/2011 Document Revised: 04/19/2014 Document Reviewed: 07/30/2011 [facility] Patient Information 2015 [place], [place]. This information is not intended to replace advice given  to you by your health care provider. Make sure you discuss any questions you have with your health care provider.

## 2018-06-30 ENCOUNTER — Ambulatory Visit: Payer: 59 | Admitting: Physical Therapy

## 2018-06-30 ENCOUNTER — Encounter: Payer: Self-pay | Admitting: Physical Therapy

## 2018-06-30 DIAGNOSIS — M25612 Stiffness of left shoulder, not elsewhere classified: Secondary | ICD-10-CM

## 2018-06-30 DIAGNOSIS — M25512 Pain in left shoulder: Secondary | ICD-10-CM

## 2018-06-30 DIAGNOSIS — R293 Abnormal posture: Secondary | ICD-10-CM

## 2018-06-30 NOTE — Therapy (Signed)
[place], [place], [address] Phone: [phone]   Fax:  [phone]  Physical Therapy Treatment  Patient Details  Name: Matthew Mooney. MRN: [id number] Date of Birth: 08-03-1966 Referring Provider: Isabella Stalling    Encounter Date: 06/30/2018  PT End of Session - 06/30/18 1318    Visit Number  2    Number of Visits  16    Date for PT Re-Evaluation  08/21/18    Authorization Type  UMR    PT Start Time  1258    PT Stop Time  1332    PT Time Calculation (min)  34 min       History reviewed. No pertinent past medical history.  History reviewed. No pertinent surgical history.  There were no vitals filed for this visit.  Subjective Assessment - 06/30/18 1345    Subjective  No pain. some tightness at top of shoulder blade.     Currently in Pain?  No/denies                       [facility] Adult PT Treatment/Exercise - 06/30/18 0001      Shoulder Exercises: Seated   Other Seated Exercises  table flexion, x 10 and elbow resting on table for elbow flexion x 10 and ball squeeze x 10 2# for bicep curls    Other Seated Exercises  scap squeezes       Shoulder Exercises: Standing   Other Standing Exercises  pendulum forward, side to side and clockwise and counter clockwise      Manual Therapy   Manual Therapy  Passive ROM    Passive ROM  Flexion easily to 90 and ER easily to 20 degrees per protocol       Neck Exercises: Stretches   Upper Trapezius Stretch  3 reps;10 seconds    Levator Stretch  3 reps;10 seconds             PT Education - 06/30/18 1333    Education Details  HEP    Person(s) Educated  Patient    Methods  Explanation;Handout    Comprehension  Verbalized understanding       PT Short Term Goals - 06/26/18 1201      PT SHORT TERM GOAL #1   Title  "Independent with initial HEP    Time  4    Period  Weeks    Status  New    Target Date  07/24/18      PT  SHORT TERM GOAL #2   Title  "Demonstrate and verbalize understanding of condition management including RICE, positioning, HEP.     Time  4    Period  Weeks    Status  New    Target Date  07/24/18      PT SHORT TERM GOAL #3   Title  Pt will be able to perform AROM as per protocol within pain freee range    Time  4    Period  Weeks    Target Date  07/24/18      PT SHORT TERM GOAL #4   Title  "Demonstrate understanding of proper sitting posture, body mechanics, work ergonomics, and be more conscious of position and posture throughout the day.     Time  4    Period  Weeks    Status  New    Target Date  07/24/18        PT  Long Term Goals - 06/26/18 1203      PT LONG TERM GOAL #1   Title  "Pt will be independent with advanced HEP.     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     Time  8    Period  Weeks    Status  New    Target Date  08/21/18      PT LONG TERM GOAL #4   Title  Left  shoulder IR and ER will return to [facility] to return to pain-free ADLs such as dressing and grooming.     Time  8    Period  Weeks    Status  New    Target Date  08/21/18      PT LONG TERM GOAL #5   Title  "FOTO will improve from  44% limtation   to  27 % limitation   indicating improved functional mobility .     Time  8    Period  Weeks    Status  New    Target Date  08/21/18            Plan - 06/30/18 1341    Clinical Impression Statement  Patient enters w/o sling accomplanied by wife. Reviewed HEP. Cues given to make pendulums more passive however he reports he was not instructed to do them  passively on eval. He reports some tightness along top of shouler. Instructed him in left upper trap stretch. Began scap squeezes. Added 2# to bicep curl. Reviewed precautins and pt is aware he should perform no active movements.     PT Next Visit Plan  Review HEP pendulum, perform PROM as per protocol chandler in media section/protocols.  RTC 06-10-18 ( per protocol can begain restoring full PROM at week 4-5) Avoid UBE per protocol    PT Home Exercise Plan  pendulum, PROM flex to 90, ER 20 IR to abdomen elbow flex, ball squeeze, scap squeeze, upper trap stretch    Consulted and Agree with Plan of Care  Patient       Patient will benefit from skilled therapeutic intervention in order to improve the following deficits and impairments:  Pain, Postural dysfunction, Improper body mechanics, Impaired UE functional use, Decreased strength, Decreased range of motion, Impaired flexibility  Visit Diagnosis: Acute pain of left shoulder  Abnormal posture  Stiffness of left shoulder, not elsewhere classified     Problem List There are no active problems to display for this patient.   Dorene Ar, [place] 06/30/2018, 1:46 PM  [place], [place], [address] Phone: [phone]   Fax:  [phone]  Name: Matthew Mooney. MRN: [id number] Date of Birth: 1966/02/02

## 2018-06-30 NOTE — Patient Instructions (Signed)
   Copyright  VHI. All rights reserved.  Side-Bending   One hand on opposite side of head, pull head to side as far as is comfortable. Stop if there is pain. Hold __30__ seconds. Repeat with other hand to other side. Repeat ___3_ times. Do __2__ sessions per day.   Copyright  VHI. All rights reserved.  Scapular Retraction (Standing)   With arms at sides, pinch shoulder blades together. Repeat ___10_ times per set. Do ___3_ sets per session. Do __3__ sessions per day.

## 2018-07-01 ENCOUNTER — Ambulatory Visit: Payer: 59

## 2018-07-07 ENCOUNTER — Ambulatory Visit: Payer: 59

## 2018-07-07 DIAGNOSIS — R293 Abnormal posture: Secondary | ICD-10-CM | POA: Diagnosis not present

## 2018-07-07 DIAGNOSIS — M25512 Pain in left shoulder: Secondary | ICD-10-CM | POA: Diagnosis not present

## 2018-07-07 DIAGNOSIS — M25612 Stiffness of left shoulder, not elsewhere classified: Secondary | ICD-10-CM | POA: Diagnosis not present

## 2018-07-07 NOTE — Therapy (Signed)
[place], [place], [address] Phone: [phone]   Fax:  [phone]  Physical Therapy Treatment  Patient Details  Name: Matthew Mooney. MRN: [id number] Date of Birth: 07/05/1966 Referring Provider: Isabella Stalling    Encounter Date: 07/07/2018  PT End of Session - 07/07/18 1042    Visit Number  3    Number of Visits  16    Date for PT Re-Evaluation  08/21/18    Authorization Type  UMR    PT Start Time  1040    PT Stop Time  1118    PT Time Calculation (min)  38 min    Activity Tolerance  Patient tolerated treatment well    Behavior During Therapy  [facility] for tasks assessed/performed       History reviewed. No pertinent past medical history.  History reviewed. No pertinent surgical history.  There were no vitals filed for this visit.  Subjective Assessment - 07/07/18 1049    Subjective  Mild soreness.  No sling but is supposed to wear the sling.     Patient is accompained by:  Family member    Pain Score  2     Pain Orientation  Left;Anterior    Pain Descriptors / Indicators  Sore    Pain Type  Surgical pain    Pain Onset  1 to 4 weeks ago    Aggravating Factors   moving arm    Pain Relieving Factors  ice , medication                               PT Education - 07/07/18 1124    Education Details  Much time spent with pt reviewing precautions and need to follow these to protect repair and not incr pain    Person(s) Educated  Patient;Spouse    Methods  Explanation    Comprehension  Verbalized understanding       PT Short Term Goals - 07/07/18 1123      PT SHORT TERM GOAL #1   Title  "Independent with initial HEP    Status  Achieved      PT SHORT TERM GOAL #2   Title  "Demonstrate and verbalize understanding of condition management including RICE, positioning, HEP.     Baseline  He continues to be too active with LT arm    Status  On-going      PT SHORT  TERM GOAL #3   Title  Pt will be able to perform AROM as per protocol within pain freee range    Baseline  Start whenhe returns from vacation    Status  On-going      PT SHORT TERM GOAL #4   Title  "Demonstrate understanding of proper sitting posture, body mechanics, work ergonomics, and be more conscious of position and posture throughout the day.     Status  On-going        PT Long Term Goals - 06/26/18 1203      PT LONG TERM GOAL #1   Title  "Pt will be independent with advanced HEP.     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     Time  8    Period  Weeks    Status  New    Target Date  08/21/18      PT LONG TERM GOAL #4   Title  Left  shoulder IR and ER will return to [facility] to return to pain-free ADLs such as dressing and grooming.     Time  8    Period  Weeks    Status  New    Target Date  08/21/18      PT LONG TERM GOAL #5   Title  "FOTO will improve from  44% limtation   to  27 % limitation   indicating improved functional mobility .     Time  8    Period  Weeks    Status  New    Target Date  08/21/18            Plan - 07/07/18 1042    Clinical Impression Statement  Mr Verga is doing very well with ggood passive ROM . i ssis not stress end range  with rotation and abduction.  He was asking if he could resume activity such as riding mower. I indicated he should not be lifting or stabilizing arm with force . He reported he  did some hedge trimming nad was mildly sore today from this. He was cautioned that he was still in period of damage to repair  and did not need to use  his arm for any lifting or possibel loaded activity.  As his ROM is good for 4 weeks I agreed with hime due to distance from home and going on vacation he could return in 2 weeks for follow up and he will be 6 weeks out from surgery and safe to progress to AROM /AAROM as tolerated.       PT Treatment/Interventions  Dry needling;Cryotherapy;Electrical Stimulation;Iontophoresis 4mg /ml Dexamethasone;Moist Heat;Ultrasound;Therapeutic exercise;Therapeutic activities;Neuromuscular re-education;Patient/family education;Manual techniques;Passive range of motion;Taping    PT Next Visit Plan  , perform PROM as per protocol Chandler in media section/protocols.  RTC 06-10-18 ( per protocol can begain restoring full PROM at week 4-5) Avoid UBE per protocol  Begin week 6 protocol when returns    PT Home Exercise Plan  pendulum, PROM flex to 90, ER 20 IR to abdomen elbow flex, ball squeeze, scap squeeze, upper trap stretch    Consulted and Agree with Plan of Care  Patient;Family member/caregiver    Family Member Consulted  wife       Patient will benefit from skilled therapeutic intervention in order to improve the following deficits and impairments:  Pain, Postural dysfunction, Improper body mechanics, Impaired UE functional use, Decreased strength, Decreased range of motion, Impaired flexibility  Visit Diagnosis: Acute pain of left shoulder  Abnormal posture  Stiffness of left shoulder, not elsewhere classified     Problem List There are no active problems to display for this patient.   Darrel Hoover 07/07/2018, 11:25 AM  [facility] [address] [place], [place], [address] Phone: [phone]   Fax:  [phone]  Name: Antionne Enrique. MRN: [id number] Date of  Birth: [birth year]

## 2018-07-08 ENCOUNTER — Ambulatory Visit: Payer: 59

## 2018-07-14 ENCOUNTER — Encounter: Payer: 59 | Admitting: Physical Therapy

## 2018-07-15 ENCOUNTER — Encounter: Payer: 59 | Admitting: Physical Therapy

## 2018-07-21 ENCOUNTER — Encounter: Payer: Self-pay | Admitting: Physical Therapy

## 2018-07-21 ENCOUNTER — Ambulatory Visit: Payer: 59 | Attending: Orthopedic Surgery | Admitting: Physical Therapy

## 2018-07-21 DIAGNOSIS — R293 Abnormal posture: Secondary | ICD-10-CM | POA: Diagnosis not present

## 2018-07-21 DIAGNOSIS — M25512 Pain in left shoulder: Secondary | ICD-10-CM | POA: Insufficient documentation

## 2018-07-21 DIAGNOSIS — M25612 Stiffness of left shoulder, not elsewhere classified: Secondary | ICD-10-CM | POA: Diagnosis not present

## 2018-07-21 MED FILL — FLUoxetine HCL 20 MG CAPS: 20 | 90 days supply | Qty: 90 | Fill #1

## 2018-07-21 NOTE — Therapy (Signed)
[place], [place], [address] Phone: [phone]   Fax:  [phone]  Physical Therapy Treatment  Patient Details  Name: Matthew Mooney. MRN: [id number] Date of Birth: 1966-01-29 Referring Provider: Isabella Stalling    Encounter Date: 07/21/2018  PT End of Session - 07/21/18 1153    Visit Number  4    Number of Visits  16    Date for PT Re-Evaluation  08/21/18    Authorization Type  UMR    PT Start Time  1145    PT Stop Time  0125    PT Time Calculation (min)  820 min    Activity Tolerance  Patient tolerated treatment well    Behavior During Therapy  [facility] for tasks assessed/performed       History reviewed. No pertinent past medical history.  History reviewed. No pertinent surgical history.  There were no vitals filed for this visit.  Subjective Assessment - 07/21/18 1152    Subjective  Patient continues to have mild soreness buit her reports it isnt too bad. He is still working full time.     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    Pain Score  2     Pain Orientation  Left    Pain Descriptors / Indicators  Aching    Pain Type  Surgical pain    Pain Onset  1 to 4 weeks ago    Aggravating Factors   moving the arm     Pain Relieving Factors  ice and medication                        [facility] Adult PT Treatment/Exercise - 07/21/18 0001      Shoulder Exercises: Supine   Other Supine Exercises  wand flexion 2x10; wand ER 2x10 5 sec hold      Shoulder Exercises: Standing   Extension  Theraband;20 reps    Theraband Level (Shoulder Extension)  Level 2 (Red)    Row  Theraband;20 reps    Theraband Level (Shoulder Row)  Level 2 (Red)      Manual Therapy   Manual Therapy  Passive ROM;Joint mobilization    Joint Mobilization  grade II and III PA and AP mobilizations  in all planes     Passive ROM  PROM in all palnes              PT Education - 07/21/18 1153    Education Details  updated HEP     Person(s) Educated  Patient    Methods  Explanation;Demonstration;Tactile cues;Verbal cues    Comprehension  Verbalized understanding;Returned demonstration;Tactile cues required;Verbal cues required;Need further instruction       PT Short Term Goals - 07/07/18 1123      PT SHORT TERM GOAL #1   Title  "Independent with initial HEP    Status  Achieved      PT SHORT TERM GOAL #2   Title  "Demonstrate and verbalize understanding of condition management including RICE, positioning, HEP.     Baseline  He continues to be too active with LT arm    Status  On-going      PT SHORT TERM GOAL #3   Title  Pt will be able to perform AROM as  per protocol within pain freee range    Baseline  Start whenhe returns from vacation    Status  On-going      PT SHORT TERM GOAL #4   Title  "Demonstrate understanding of proper sitting posture, body mechanics, work ergonomics, and be more conscious of position and posture throughout the day.     Status  On-going        PT Long Term Goals - 06/26/18 1203      PT LONG TERM GOAL #1   Title  "Pt will be independent with advanced HEP.     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     Time  8    Period  Weeks    Status  New    Target Date  08/21/18      PT LONG TERM GOAL #4   Title  Left  shoulder IR and ER will return to [facility] to return to pain-free ADLs such as dressing and grooming.     Time  8    Period  Weeks     Status  New    Target Date  08/21/18      PT LONG TERM GOAL #5   Title  "FOTO will improve from  44% limtation   to  27 % limitation   indicating improved functional mobility .     Time  8    Period  Weeks    Status  New    Target Date  08/21/18            Plan - 07/21/18 1359    Clinical Impression Statement  Patient tolerated new exercises well. He was strongly advised to stick with the exercises and stretches that hew as given. He was wondering about mowing. He was advised that if he has a sudden pull on his shoulder he could still re-tear.  He would like to schedule 1x a week 2nd to his drive from [place]. He was given an updated HEP for home.     Clinical Presentation  Stable    Clinical Decision Making  Low    Rehab Potential  Excellent    PT Frequency  2x / week    PT Duration  8 weeks    PT Treatment/Interventions  Dry needling;Cryotherapy;Electrical Stimulation;Iontophoresis 4mg /ml Dexamethasone;Moist Heat;Ultrasound;Therapeutic exercise;Therapeutic activities;Neuromuscular re-education;Patient/family education;Manual techniques;Passive range of motion;Taping    PT Next Visit Plan  , perform PROM as per protocol Chandler in media section/protocols.  RTC 06-10-18 ( per protocol can begain restoring full PROM at week 4-5) Avoid UBE per protocol  Begin week 6 protocol when returns    PT Home Exercise Plan  pendulum, PROM flex to 90, ER 20 IR to abdomen elbow flex, ball squeeze, scap squeeze, upper trap stretch    Consulted and Agree with Plan of Care  Patient    Family Member Consulted  wife  Patient will benefit from skilled therapeutic intervention in order to improve the following deficits and impairments:  Pain, Postural dysfunction, Improper body mechanics, Impaired UE functional use, Decreased strength, Decreased range of motion, Impaired flexibility  Visit Diagnosis: Acute pain of left shoulder  Abnormal posture  Stiffness of left shoulder, not elsewhere  classified     Problem List There are no active problems to display for this patient.   Carney Living PT DPT  07/21/2018, 3:47 PM  [facility] [address] [place], [place], [address] Phone: [phone]   Fax:  [phone]  Name: Matthew Mooney. MRN: [id number] Date of Birth: November 14, 1966

## 2018-07-22 ENCOUNTER — Ambulatory Visit: Payer: 59 | Admitting: Physical Therapy

## 2018-07-28 ENCOUNTER — Encounter: Payer: Self-pay | Admitting: Physical Therapy

## 2018-07-28 ENCOUNTER — Ambulatory Visit: Payer: 59 | Admitting: Physical Therapy

## 2018-07-28 DIAGNOSIS — R293 Abnormal posture: Secondary | ICD-10-CM

## 2018-07-28 DIAGNOSIS — M25612 Stiffness of left shoulder, not elsewhere classified: Secondary | ICD-10-CM | POA: Diagnosis not present

## 2018-07-28 DIAGNOSIS — M25512 Pain in left shoulder: Secondary | ICD-10-CM

## 2018-07-28 DIAGNOSIS — Z9889 Other specified postprocedural states: Secondary | ICD-10-CM | POA: Diagnosis not present

## 2018-07-29 NOTE — Therapy (Signed)
[place], [place], [address] Phone: [phone]   Fax:  [phone]  Physical Therapy Treatment  Patient Details  Name: Matthew Mooney. MRN: [id number] Date of Birth: 1966/05/17 Referring Provider: Isabella Stalling    Encounter Date: 07/28/2018  PT End of Session - 07/29/18 0849    Visit Number  5    Number of Visits  16    Date for PT Re-Evaluation  08/21/18    Authorization Type  UMR    PT Start Time  1630    PT Stop Time  1710    PT Time Calculation (min)  40 min    Activity Tolerance  Patient tolerated treatment well    Behavior During Therapy  [facility] for tasks assessed/performed       History reviewed. No pertinent past medical history.  History reviewed. No pertinent surgical history.  There were no vitals filed for this visit.  Subjective Assessment - 07/28/18 1636    Subjective  Patient reports he has not been doing his exercises but he has been using his shoulder to clean the garage and he continues to work.     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  Yes    Pain Score  3     Pain Location  Shoulder    Pain Orientation  Left    Pain Descriptors / Indicators  Aching    Pain Type  Surgical pain    Pain Onset  1 to 4 weeks ago    Pain Frequency  Constant    Aggravating Factors   moving the arm     Pain Relieving Factors  ice and medication                        [facility] Adult PT Treatment/Exercise - 07/29/18 0001      Shoulder Exercises: Supine   Other Supine Exercises  wand flexion 2x10; wand ER 2x10 5 sec hold      Shoulder Exercises: Standing   External Rotation  20 reps    Theraband Level (Shoulder External Rotation)  Level 2 (Red)    External Rotation Limitations  cuing to not go pout too far     Internal Rotation  20 reps;Right    Theraband Level (Shoulder Internal Rotation)  Level  2 (Red)    Extension  Theraband;20 reps    Theraband Level (Shoulder Extension)  Level 3 (Green)    Row  Theraband;20 reps    Theraband Level (Shoulder Row)  Level 3 (Green)      Manual Therapy   Manual Therapy  Passive ROM;Joint mobilization    Joint Mobilization  grade II and III PA and AP mobilizations in all planes     Passive ROM  PROM in all palnes              PT Education - 07/29/18 0811    Education Details  reviewed HEP     Person(s) Educated  Patient    Methods  Explanation;Demonstration;Tactile cues;Verbal cues    Comprehension  Verbalized understanding;Returned demonstration;Tactile cues required;Verbal cues required       PT Short Term Goals - 07/29/18 0848      PT SHORT TERM GOAL #1   Title  "Independent with initial HEP    Time  4  Period  Weeks    Status  Achieved      PT SHORT TERM GOAL #2   Title  "Demonstrate and verbalize understanding of condition management including RICE, positioning, HEP.     Baseline  He continues to be too active with LT arm    Time  4    Period  Weeks    Status  On-going      PT SHORT TERM GOAL #3   Title  Pt will be able to perform AROM as per protocol within pain freee range    Baseline  Start whenhe returns from vacation    Time  4    Period  Weeks    Status  On-going      PT SHORT TERM GOAL #4   Title  "Demonstrate understanding of proper sitting posture, body mechanics, work ergonomics, and be more conscious of position and posture throughout the day.     Time  4    Period  Weeks    Status  On-going        PT Long Term Goals - 06/26/18 1203      PT LONG TERM GOAL #1   Title  "Pt will be independent with advanced HEP.     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     Time  8    Period  Weeks    Status  New    Target Date  08/21/18      PT LONG TERM GOAL #4   Title  Left  shoulder IR and ER will return to [facility] to return to pain-free ADLs such as dressing and grooming.     Time  8    Period  Weeks    Status  New    Target Date  08/21/18      PT LONG TERM GOAL #5   Title  "FOTO will improve from  44% limtation   to  27 % limitation   indicating improved functional mobility .     Time  8    Period  Weeks    Status  New    Target Date  08/21/18            Plan - 07/29/18 4193    Clinical Impression Statement  Patient also reported he has been weed wacking. He is very non-compliant. He reprots soreness after what he did yesterday in his garage. His range is still doing well but he has not gained any range in external rotation. His wife was here to encourage him but it highly likley that he will not do his exercises. If he continues to be non-complaint it may be benificial just to discharge him to whatever HEP he will do. He is at a high risk right now. Therapy attempted to educate the patient on his risk of re-tear but he did not appear to be interested  in listening.     Clinical Presentation  Stable    Clinical Decision Making  Low    PT Frequency  2x / week    PT Duration  8 weeks    PT Treatment/Interventions  Dry needling;Cryotherapy;Electrical Stimulation;Iontophoresis 4mg /ml Dexamethasone;Moist Heat;Ultrasound;Therapeutic exercise;Therapeutic activities;Neuromuscular re-education;Patient/family education;Manual techniques;Passive range of motion;Taping    PT Next Visit Plan  , perform PROM as per protocol Chandler in media section/protocols.  RTC 06-10-18 ( per protocol can begain restoring full PROM at week 4-5) Avoid UBE per protocol  Begin week 6 protocol when returns    PT Home  Exercise Plan  pendulum, PROM flex to 90, ER 20 IR to abdomen elbow flex, ball squeeze, scap squeeze, upper trap stretch    Consulted and Agree with Plan of Care  Patient       Patient will benefit from skilled therapeutic intervention in order to improve the following deficits and impairments:  Pain, Postural dysfunction, Improper body mechanics, Impaired UE functional use, Decreased strength, Decreased range of motion, Impaired flexibility  Visit Diagnosis: Acute pain of left shoulder  Abnormal posture  Stiffness of left shoulder, not elsewhere classified     Problem List There are no active problems to display for this patient.   Carney Living PT DPT  07/29/2018, 8:50 AM  [facility] [address] [place], [place], [address] Phone: [phone]   Fax:  [phone]  Name: Matthew Mooney. MRN: [id number] Date of Birth: 12/21/1965

## 2018-08-05 ENCOUNTER — Encounter: Payer: Self-pay | Admitting: Physical Therapy

## 2018-08-05 ENCOUNTER — Ambulatory Visit: Payer: 59 | Admitting: Physical Therapy

## 2018-08-05 DIAGNOSIS — M25612 Stiffness of left shoulder, not elsewhere classified: Secondary | ICD-10-CM | POA: Diagnosis not present

## 2018-08-05 DIAGNOSIS — R293 Abnormal posture: Secondary | ICD-10-CM | POA: Diagnosis not present

## 2018-08-05 DIAGNOSIS — M25512 Pain in left shoulder: Secondary | ICD-10-CM

## 2018-08-05 NOTE — Therapy (Addendum)
[place], [place], [address] Phone: [phone]   Fax:  [phone]  Physical Therapy Treatment/Discharge Note  Patient Details  Name: Matthew Mooney. MRN: [id number] Date of Birth: 04-Nov-1966 Referring Provider: Isabella Stalling    Encounter Date: 08/05/2018  PT End of Session - 08/05/18 1043    Visit Number  6    Number of Visits  16    Date for PT Re-Evaluation  08/21/18    Authorization Type  [facility]    PT Start Time  1018    PT Stop Time  1050    PT Time Calculation (min)  32 min    Activity Tolerance  Patient tolerated treatment well    Behavior During Therapy  [facility] for tasks assessed/performed       History reviewed. No pertinent past medical history.  History reviewed. No pertinent surgical history.  There were no vitals filed for this visit.  Subjective Assessment - 08/05/18 1022    Subjective  Pt  reports he is about 7 weeks post surgery for RTC.  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  He mowed 4 yards on riding lawnmower this weekend.    Pertinent History  right elbow 2015 after falling off roof    Limitations  Lifting;House hold activities    Diagnostic tests  MRI      Patient Stated Goals  return to work as a Art gallery manager 10 hours a day    Currently in Pain?  Yes    Pain Score  2     Pain Location  Shoulder    Pain Orientation  Left    Pain Descriptors / Indicators  Aching;Tightness    Pain Type  Surgical pain    Pain Onset  More than a month ago   7 weeks        [facility] PT Assessment - 08/05/18 1024      Observation/Other Assessments   Focus on Therapeutic Outcomes (FOTO)   FOTO intake 73% limitation 25%  predicted 27%      AROM   Overall AROM   Deficits    Right Shoulder Flexion  150 Degrees    Right Shoulder ABduction  144 Degrees    Right Shoulder Internal Rotation  60 Degrees    Right Shoulder External Rotation  80 Degrees    Left Shoulder Extension  15 Degrees    Left  Shoulder Flexion  130 Degrees   Pt comes to clinic lifting arm to 90/ ordersare PROM   Left Shoulder ABduction  119 Degrees    Left Shoulder Internal Rotation  40 Degrees    Left Shoulder External Rotation  34 Degrees      Strength   Overall Strength  Deficits    Right Shoulder Flexion  5/5    Right Shoulder Extension  5/5    Right Shoulder ABduction  5/5    Right Shoulder Internal Rotation  5/5    Right Shoulder External Rotation  5/5    Left Shoulder Flexion  3-/5    Left Shoulder Extension  4-/5    Left Shoulder ABduction  4-/5    Left Shoulder Internal Rotation  3-/5   not full range   Left Shoulder External Rotation  3-/5   not full range                  [facility] Adult PT Treatment/Exercise - 08/05/18 1105      Self-Care  Self-Care  Other Self-Care Comments    Other Self-Care Comments   explanation of protocol and importance of not lifting and care of RTC post surgery for 12 weeks.       Shoulder Exercises: Standing   External Rotation  20 reps   bil   Theraband Level (Shoulder External Rotation)  Level 2 (Red);Level 3 (Green)    Extension  Theraband;20 reps    Theraband Level (Shoulder Extension)  Level 3 (Green)    Row  Theraband;20 reps    Theraband Level (Shoulder Row)  Level 3 (Green)      Manual Therapy   Manual Therapy  Passive ROM;Joint mobilization    Joint Mobilization  grade II and III PA and AP mobilizations in all planes     Passive ROM  PROM in all palnes    tightness on ER            PT Education - 08/05/18 1103    Education Details  Added to HEP and went over RTC and lifting precautians and addressed non compliance    Person(s) Educated  Patient;Spouse    Methods  Explanation;Demonstration;Tactile cues;Verbal cues;Handout    Comprehension  Verbalized understanding;Returned demonstration       PT Short Term Goals - 08/05/18 1316      PT SHORT TERM GOAL #1   Title  "Independent with initial HEP    Time  4    Period  Weeks     Status  Achieved      PT SHORT TERM GOAL #2   Title  "Demonstrate and verbalize understanding of condition management including RICE, positioning, HEP.     Baseline  He continues to be too active with LT arm, Pt understands but continues to be non compliant    Time  4    Period  Weeks    Status  On-going      PT SHORT TERM GOAL #3   Title  Pt will be able to perform AROM as per protocol within pain freee range    Baseline  Pt not following Protocol, Pt states he is not lifting but then explains he is installing light fixture.     Time  4    Period  Weeks    Status  Not Met      PT SHORT TERM GOAL #4   Title  "Demonstrate understanding of proper sitting posture, body mechanics, work ergonomics, and be more conscious of position and posture throughout the day.     Baseline  educated but unsure of compliance    Time  4    Period  Weeks    Status  Partially Met        PT Long Term Goals - 08/05/18 1318      PT LONG TERM GOAL #1   Title  "Pt will be independent with advanced HEP.     Baseline  Pt is non compliant with exercise as expressed by pt and wife    Time  8    Status  Not Met      PT LONG TERM GOAL #2   Title  "Pain will decrease to 1/10 with all functional activities especially above 90 degree sustained hold as utilized for barbershop work duties    Baseline  Pt with 2/10 pain today but is back at work full time as a Art gallery manager    Time  8    Period  Weeks    Status  Partially Met  PT LONG TERM GOAL #3   Title  Left  shoulder AROM scaption will improve to 0-160 degrees for improved overhead reaching.     Baseline  not met  see flow sheet.  flex 130, abd 119,     Time  8    Period  Weeks    Status  Partially Met      PT LONG TERM GOAL #4   Title  Left  shoulder IR and ER will return to [facility] to return to pain-free ADLs such as dressing and grooming.     Baseline  limited ER and IR See flowsheet.  Not compliant     Time  8    Period  Weeks    Status  Not Met       PT LONG TERM GOAL #5   Title  "FOTO will improve from  44% limtation   to  27 % limitation   indicating improved functional mobility .     Baseline  Foto limitation 25%    Time  8    Period  Weeks    Status  Achieved            Plan - 08/05/18 1059    Clinical Impression Statement  Pt reports mowing 4 yards on riding lawnmower and working as a Art gallery manager all day. full time work.  He also reports installing a light fixture above his head.  Pt stated he was noncompliant with exercises.  Wife came at end of session and it upset that he is noncompliant with exercises.  Pt requests DC since he is going to continue doing what he wants to do even though he has been  continuously warned of lifting and non compliance and not allowing RTC surgery time to heal without excessive motion or lifting.  Pt understands he is non compliant and recognizes he is taking responsiblity for own actions against better advice of MD, PT and wife.  Will DC as per pt non compliance. and request.     Rehab Potential  Excellent    PT Frequency  2x / week    PT Duration  8 weeks    PT Treatment/Interventions  Dry needling;Cryotherapy;Electrical Stimulation;Iontophoresis 65m/ml Dexamethasone;Moist Heat;Ultrasound;Therapeutic exercise;Therapeutic activities;Neuromuscular re-education;Patient/family education;Manual techniques;Passive range of motion;Taping    PT Next Visit Plan  DC as per pt non compliance and request    PT Home Exercise Plan  pendulum, PROM flex to 90, ER 20 IR to abdomen elbow flex, ball squeeze, scap squeeze, upper trap stretch standing sub scapular    Consulted and Agree with Plan of Care  Patient    Family Member Consulted  wife       Patient will benefit from skilled therapeutic intervention in order to improve the following deficits and impairments:  Pain, Postural dysfunction, Improper body mechanics, Impaired UE functional use, Decreased strength, Decreased range of motion, Impaired  flexibility  Visit Diagnosis: Acute pain of left shoulder  Abnormal posture  Stiffness of left shoulder, not elsewhere classified     Problem List There are no active problems to display for this patient.   LVoncille Lo PT Certified Exercise Expert for the Aging Adult  08/05/18 1:26 PM Phone: [phone]: 3[facility]GHen[address] [address]Phone: [phone]  Fax:  [phone] Name: Matthew Mooney MRN: [id number] of Birth: 208/25/1967  PHYSICAL THERAPY DISCHARGE SUMMARY  Visits from Start of Care: 6  Current functional  level related to goals / functional outcomes: As above  Pt non compliant with exercise at home and precautians.  Pt is working full time as a Art gallery manager and he mows lawns with riding Conservation officer, nature and installed light fixture.     Remaining deficits: As above    Education / Equipment: HEP within protocol but pt is non compliant Plan: Patient agrees to discharge.  Patient goals were not met. Patient is being discharged due to the patient's request.  ?????    And non compliance with protocol. Pt does not exericse at home nor follow safe protocol. Pt is aware of not following protocol and takes responsibility   Voncille Lo, PT Certified Exercise Expert for the Aging Adult  08/05/18 1:28 PM Phone: [phone] Fax: [phone]

## 2018-08-05 NOTE — Patient Instructions (Addendum)
       Voncille Lo, PT Certified Exercise Expert for the Aging Adult  08/05/18 10:56 AM Phone: [phone] Fax: [phone] [email or web address]

## 2018-08-14 MED FILL — SILDENAFIL CITRATE 100 MG T: 100 | 30 days supply | Qty: 6 | Fill #1

## 2018-08-14 MED FILL — CELECOXIB 200 MG CAP: 200 | 90 days supply | Qty: 180 | Fill #2

## 2018-08-19 ENCOUNTER — Encounter: Payer: 59 | Admitting: Physical Therapy

## 2018-08-25 ENCOUNTER — Encounter: Payer: 59 | Admitting: Physical Therapy

## 2018-09-15 DIAGNOSIS — M79641 Pain in right hand: Secondary | ICD-10-CM | POA: Diagnosis not present

## 2018-09-15 DIAGNOSIS — M79642 Pain in left hand: Secondary | ICD-10-CM | POA: Diagnosis not present

## 2018-09-16 DIAGNOSIS — M79644 Pain in right finger(s): Secondary | ICD-10-CM | POA: Diagnosis not present

## 2018-09-30 MED FILL — TRAMADOL-ACETAMINOPHN 37.5-: 37.5-325 | 45 days supply | Qty: 90 | Fill #0

## 2018-09-30 MED FILL — SILDENAFIL CITRATE 100 MG T: 100 | 30 days supply | Qty: 6 | Fill #2

## 2018-10-13 MED FILL — FLUoxetine HCL 20 MG CAPS: 20 | 90 days supply | Qty: 90 | Fill #2

## 2018-10-21 DIAGNOSIS — Z23 Encounter for immunization: Secondary | ICD-10-CM | POA: Diagnosis not present

## 2018-10-30 MED FILL — TESTOSTERONE 20.25 MG/ACT (: 20.25 MG/AC | 90 days supply | Qty: 450 | Fill #0

## 2018-12-15 MED FILL — CELECOXIB 200 MG CAP: 200 | 90 days supply | Qty: 180 | Fill #3

## 2018-12-15 MED FILL — TRAMADOL-ACETAMINOPHN 37.5-: 37.5-325 | 45 days supply | Qty: 90 | Fill #1

## 2018-12-16 DIAGNOSIS — J40 Bronchitis, not specified as acute or chronic: Secondary | ICD-10-CM | POA: Diagnosis not present

## 2018-12-16 DIAGNOSIS — Z6832 Body mass index (BMI) 32.0-32.9, adult: Secondary | ICD-10-CM | POA: Diagnosis not present

## 2018-12-16 DIAGNOSIS — M7581 Other shoulder lesions, right shoulder: Secondary | ICD-10-CM | POA: Diagnosis not present

## 2018-12-16 DIAGNOSIS — J329 Chronic sinusitis, unspecified: Secondary | ICD-10-CM | POA: Diagnosis not present

## 2018-12-16 MED FILL — CEFDINIR 300 MG CAPSULE: 300 | 10 days supply | Qty: 20 | Fill #0

## 2018-12-16 MED FILL — predniSONE 20 MG TABS: 20 | 6 days supply | Qty: 12 | Fill #0

## 2018-12-16 MED FILL — tiZANidine HCL 4 MG TABS: 4 | 30 days supply | Qty: 30 | Fill #0

## 2019-01-12 MED FILL — FLUoxetine HCL 20 MG CAPS: 20 | 90 days supply | Qty: 90 | Fill #3

## 2019-01-12 MED FILL — KETOCONAZOLE 2% CREAM: 2 | 14 days supply | Qty: 30 | Fill #0

## 2019-01-12 MED FILL — AMITRIPTYLINE HCL 25 MG TAB: 25 | 90 days supply | Qty: 90 | Fill #0

## 2019-02-03 MED FILL — TESTOSTERONE 20.25 MG/ACT (: 20.25 MG/AC | 90 days supply | Qty: 450 | Fill #1

## 2019-03-11 MED FILL — TRAMADOL-ACETAMINOPHN 37.5-: 37.5-325 | 45 days supply | Qty: 90 | Fill #0

## 2019-03-11 MED FILL — CELECOXIB 200 MG CAP: 200 | 90 days supply | Qty: 180 | Fill #0

## 2019-03-11 MED FILL — tiZANidine HCL 4 MG TABS: 4 | 30 days supply | Qty: 30 | Fill #1

## 2019-04-02 MED FILL — FLUoxetine HCL 20 MG CAPS: 20 | 90 days supply | Qty: 90 | Fill #0

## 2019-04-02 MED FILL — AMITRIPTYLINE HCL 25 MG TAB: 25 | 90 days supply | Qty: 90 | Fill #1

## 2019-05-08 MED FILL — TESTOSTERONE 20.25 MG/ACT (: 20.25 MG/AC | 90 days supply | Qty: 450 | Fill #0

## 2019-06-22 MED FILL — CELECOXIB 200 MG CAP: 200 | 90 days supply | Qty: 180 | Fill #1

## 2019-06-23 MED FILL — TERBINAFINE HCL 250 MG TAB: 250 | 90 days supply | Qty: 90 | Fill #0

## 2019-06-23 MED FILL — DICLOFENAC SODIUM 1 % GEL: 1 | 32 days supply | Qty: 500 | Fill #0

## 2019-06-23 MED FILL — AMITRIPTYLINE HCL 25 MG TAB: 25 | 90 days supply | Qty: 90 | Fill #0

## 2019-06-23 MED FILL — TRAMADOL-ACETAMINOPHN 37.5-: 37.5-325 | 45 days supply | Qty: 90 | Fill #0

## 2019-06-30 MED FILL — MUPIROCIN 2% OINTMENT: 2 | 10 days supply | Qty: 22 | Fill #0

## 2019-07-21 MED FILL — MUPIROCIN 2% OINTMENT: 2 | 10 days supply | Qty: 22 | Fill #0

## 2019-07-21 MED FILL — FLUoxetine HCL 20 MG CAPS: 20 | 90 days supply | Qty: 90 | Fill #0

## 2019-08-25 MED FILL — TESTOSTERONE 20.25 MG/ACT (: 20.25 MG/AC | 90 days supply | Qty: 450 | Fill #1

## 2019-09-28 MED FILL — TRAMADOL-ACETAMINOPHN 37.5-: 37.5-325 | 45 days supply | Qty: 90 | Fill #1

## 2019-09-28 MED FILL — AMITRIPTYLINE HCL 25 MG TAB: 25 | 90 days supply | Qty: 90 | Fill #1

## 2019-09-28 MED FILL — CELECOXIB 200 MG CAP: 200 | 90 days supply | Qty: 180 | Fill #2

## 2019-10-20 MED FILL — FLUoxetine HCL 20 MG CAPS: 20 | 90 days supply | Qty: 90 | Fill #1

## 2019-10-20 MED FILL — tiZANidine HCL 4 MG TABS: 4 | 30 days supply | Qty: 30 | Fill #0

## 2019-12-14 MED FILL — tiZANidine HCL 4 MG TABS: 4 | 30 days supply | Qty: 30 | Fill #1

## 2019-12-22 MED FILL — CELECOXIB 200 MG CAP: 200 | 90 days supply | Qty: 180 | Fill #0

## 2019-12-22 MED FILL — AMITRIPTYLINE HCL 25 MG TAB: 25 | 90 days supply | Qty: 90 | Fill #2

## 2019-12-22 MED FILL — TESTOSTERONE 20.25 MG/ACT (: 20.25 MG/AC | 90 days supply | Qty: 450 | Fill #0

## 2020-01-27 MED FILL — TRAMADOL-ACETAMINOPHN 37.5-: 37.5-325 | 45 days supply | Qty: 90 | Fill #0

## 2020-01-27 MED FILL — FLUoxetine HCL 20 MG CAPS: 20 | 90 days supply | Qty: 90 | Fill #2

## 2020-03-24 MED FILL — TESTOSTERONE 20.25 MG/ACT (: 20.25 MG/AC | 90 days supply | Qty: 450 | Fill #1

## 2020-03-24 MED FILL — TRAMADOL-ACETAMINOPHN 37.5-: 37.5-325 | 45 days supply | Qty: 90 | Fill #1

## 2020-03-24 MED FILL — CELECOXIB 200 MG CAP: 200 | 90 days supply | Qty: 180 | Fill #1

## 2020-03-24 MED FILL — AMITRIPTYLINE HCL 25 MG TAB: 25 | 90 days supply | Qty: 90 | Fill #3

## 2020-04-14 MED FILL — FLUoxetine HCL 20 MG CAPS: 20 | 90 days supply | Qty: 90 | Fill #3

## 2020-04-19 ENCOUNTER — Other Ambulatory Visit: Payer: Self-pay | Admitting: Orthopedic Surgery

## 2020-06-03 ENCOUNTER — Other Ambulatory Visit: Payer: Self-pay

## 2020-06-03 ENCOUNTER — Encounter (HOSPITAL_BASED_OUTPATIENT_CLINIC_OR_DEPARTMENT_OTHER): Payer: Self-pay | Admitting: Orthopedic Surgery

## 2020-06-06 NOTE — Progress Notes (Signed)

## 2020-06-09 ENCOUNTER — Other Ambulatory Visit (HOSPITAL_COMMUNITY)
Admission: RE | Admit: 2020-06-09 | Discharge: 2020-06-09 | Disposition: A | Discharge status: Home / Self Care | Payer: No Typology Code available for payment source | Source: Ambulatory Visit | Attending: Orthopedic Surgery | Admitting: Orthopedic Surgery

## 2020-06-09 DIAGNOSIS — Z20822 Contact with and (suspected) exposure to covid-19: Secondary | ICD-10-CM | POA: Insufficient documentation

## 2020-06-09 DIAGNOSIS — Z01812 Encounter for preprocedural laboratory examination: Secondary | ICD-10-CM | POA: Diagnosis present

## 2020-06-09 LAB — SARS CORONAVIRUS 2 (TAT 6-24 HRS): SARS Coronavirus 2: NEGATIVE

## 2020-06-13 ENCOUNTER — Encounter (HOSPITAL_BASED_OUTPATIENT_CLINIC_OR_DEPARTMENT_OTHER)
Admission: RE | Disposition: A | Discharge status: Home / Self Care | Payer: Self-pay | Source: Home / Self Care | Attending: Orthopedic Surgery

## 2020-06-13 ENCOUNTER — Encounter (HOSPITAL_BASED_OUTPATIENT_CLINIC_OR_DEPARTMENT_OTHER): Payer: Self-pay | Admitting: Orthopedic Surgery

## 2020-06-13 ENCOUNTER — Ambulatory Visit (HOSPITAL_BASED_OUTPATIENT_CLINIC_OR_DEPARTMENT_OTHER): Payer: No Typology Code available for payment source | Admitting: Certified Registered"

## 2020-06-13 ENCOUNTER — Other Ambulatory Visit: Payer: Self-pay

## 2020-06-13 ENCOUNTER — Ambulatory Visit (HOSPITAL_BASED_OUTPATIENT_CLINIC_OR_DEPARTMENT_OTHER)
Admission: RE | Admit: 2020-06-13 | Discharge: 2020-06-13 | Disposition: A | Discharge status: Home / Self Care | Payer: No Typology Code available for payment source | Attending: Orthopedic Surgery | Admitting: Orthopedic Surgery

## 2020-06-13 DIAGNOSIS — Z7982 Long term (current) use of aspirin: Secondary | ICD-10-CM | POA: Insufficient documentation

## 2020-06-13 DIAGNOSIS — Z6833 Body mass index (BMI) 33.0-33.9, adult: Secondary | ICD-10-CM | POA: Diagnosis not present

## 2020-06-13 DIAGNOSIS — Z79899 Other long term (current) drug therapy: Secondary | ICD-10-CM | POA: Diagnosis not present

## 2020-06-13 DIAGNOSIS — E669 Obesity, unspecified: Secondary | ICD-10-CM | POA: Insufficient documentation

## 2020-06-13 DIAGNOSIS — Z791 Long term (current) use of non-steroidal anti-inflammatories (NSAID): Secondary | ICD-10-CM | POA: Insufficient documentation

## 2020-06-13 DIAGNOSIS — M25811 Other specified joint disorders, right shoulder: Secondary | ICD-10-CM | POA: Insufficient documentation

## 2020-06-13 DIAGNOSIS — M75111 Incomplete rotator cuff tear or rupture of right shoulder, not specified as traumatic: Secondary | ICD-10-CM | POA: Insufficient documentation

## 2020-06-13 HISTORY — PX: SHOULDER ARTHROSCOPY: SHX128

## 2020-06-13 HISTORY — PX: SHOULDER ARTHROSCOPY WITH ROTATOR CUFF REPAIR AND SUBACROMIAL DECOMPRESSION: SHX5686

## 2020-06-13 SURGERY — SHOULDER ARTHROSCOPY WITH ROTATOR CUFF REPAIR AND SUBACROMIAL DECOMPRESSION
Anesthesia: General | Site: Shoulder | Laterality: Right

## 2020-06-13 MED ORDER — PROPOFOL 10 MG/ML IV BOLUS
INTRAVENOUS | Status: DC | PRN
  Administered 2020-06-13: 150 mg via INTRAVENOUS

## 2020-06-13 MED ORDER — TIZANIDINE HCL 4 MG PO TABS
4.0000 mg | ORAL_TABLET | Freq: Three times a day (TID) | ORAL | 1 refills | Status: AC | PRN
Start: 2020-06-13 — End: 2021-06-13

## 2020-06-13 MED ORDER — FENTANYL CITRATE (PF) 100 MCG/2ML IJ SOLN
100.0000 ug | Freq: Once | INTRAMUSCULAR | Status: AC
  Administered 2020-06-13: 100 ug via INTRAVENOUS

## 2020-06-13 MED ORDER — MIDAZOLAM HCL 2 MG/2ML IJ SOLN
INTRAMUSCULAR | Status: AC
  Filled 2020-06-13: qty 2

## 2020-06-13 MED ORDER — OXYCODONE HCL 5 MG/5ML PO SOLN: 5.0000 mg | Freq: Once | ORAL | Status: DC | PRN

## 2020-06-13 MED ORDER — OXYCODONE HCL 5 MG PO TABS: 5.0000 mg | ORAL_TABLET | Freq: Once | ORAL | Status: DC | PRN

## 2020-06-13 MED ORDER — SODIUM CHLORIDE 0.9 % IR SOLN
Status: DC | PRN
  Administered 2020-06-13: 6000 mL

## 2020-06-13 MED ORDER — BUPIVACAINE LIPOSOME 1.3 % IJ SUSP
INTRAMUSCULAR | Status: DC | PRN
Start: 2020-06-13 — End: 2020-06-13
  Administered 2020-06-13: 10 mL via PERINEURAL

## 2020-06-13 MED ORDER — LIDOCAINE 2% (20 MG/ML) 5 ML SYRINGE
INTRAMUSCULAR | Status: AC
  Filled 2020-06-13: qty 5

## 2020-06-13 MED ORDER — ONDANSETRON HCL 4 MG/2ML IJ SOLN
INTRAMUSCULAR | Status: AC
  Filled 2020-06-13: qty 2

## 2020-06-13 MED ORDER — PHENYLEPHRINE HCL (PRESSORS) 10 MG/ML IV SOLN
INTRAVENOUS | Status: AC
  Filled 2020-06-13: qty 1

## 2020-06-13 MED ORDER — AMISULPRIDE (ANTIEMETIC) 5 MG/2ML IV SOLN: 10.0000 mg | Freq: Once | INTRAVENOUS | Status: DC | PRN

## 2020-06-13 MED ORDER — CEFAZOLIN SODIUM-DEXTROSE 2-4 GM/100ML-% IV SOLN
INTRAVENOUS | Status: AC
  Filled 2020-06-13: qty 100

## 2020-06-13 MED ORDER — EPHEDRINE 5 MG/ML INJ
INTRAVENOUS | Status: AC
  Filled 2020-06-13: qty 10

## 2020-06-13 MED ORDER — PHENYLEPHRINE 40 MCG/ML (10ML) SYRINGE FOR IV PUSH (FOR BLOOD PRESSURE SUPPORT)
PREFILLED_SYRINGE | INTRAVENOUS | Status: AC
  Filled 2020-06-13: qty 10

## 2020-06-13 MED ORDER — METOPROLOL TARTRATE 5 MG/5ML IV SOLN
INTRAVENOUS | Status: AC
  Filled 2020-06-13: qty 5

## 2020-06-13 MED ORDER — BUPIVACAINE HCL (PF) 0.5 % IJ SOLN
INTRAMUSCULAR | Status: DC | PRN
  Administered 2020-06-13: 20 mL via PERINEURAL

## 2020-06-13 MED ORDER — DEXAMETHASONE SODIUM PHOSPHATE 4 MG/ML IJ SOLN
INTRAMUSCULAR | Status: DC | PRN
  Administered 2020-06-13: 10 mg via INTRAVENOUS

## 2020-06-13 MED ORDER — METOPROLOL TARTRATE 5 MG/5ML IV SOLN
2.0000 mg | Freq: Once | INTRAVENOUS | Status: AC
  Administered 2020-06-13: 2 mg via INTRAVENOUS

## 2020-06-13 MED ORDER — ALBUMIN HUMAN 5 % IV SOLN
INTRAVENOUS | Status: DC | PRN
Start: 2020-06-13 — End: 2020-06-13

## 2020-06-13 MED ORDER — PROMETHAZINE HCL 25 MG/ML IJ SOLN: 6.2500 mg | INTRAMUSCULAR | Status: DC | PRN

## 2020-06-13 MED ORDER — ONDANSETRON HCL 4 MG/2ML IJ SOLN
INTRAMUSCULAR | Status: DC | PRN
  Administered 2020-06-13: 4 mg via INTRAVENOUS

## 2020-06-13 MED ORDER — FENTANYL CITRATE (PF) 100 MCG/2ML IJ SOLN
INTRAMUSCULAR | Status: AC
  Filled 2020-06-13: qty 2

## 2020-06-13 MED ORDER — SUCCINYLCHOLINE CHLORIDE 200 MG/10ML IV SOSY
PREFILLED_SYRINGE | INTRAVENOUS | Status: AC
  Filled 2020-06-13: qty 10

## 2020-06-13 MED ORDER — CEFAZOLIN SODIUM-DEXTROSE 2-4 GM/100ML-% IV SOLN
2.0000 g | INTRAVENOUS | Status: AC
  Administered 2020-06-13: 2 g via INTRAVENOUS

## 2020-06-13 MED ORDER — PHENYLEPHRINE 40 MCG/ML (10ML) SYRINGE FOR IV PUSH (FOR BLOOD PRESSURE SUPPORT)
PREFILLED_SYRINGE | INTRAVENOUS | Status: DC | PRN
  Administered 2020-06-13 (×2): 40 ug via INTRAVENOUS
  Administered 2020-06-13: 80 ug via INTRAVENOUS

## 2020-06-13 MED ORDER — ALBUMIN HUMAN 5 % IV SOLN
INTRAVENOUS | Status: AC
  Filled 2020-06-13: qty 250

## 2020-06-13 MED ORDER — LIDOCAINE 2% (20 MG/ML) 5 ML SYRINGE
INTRAMUSCULAR | Status: DC | PRN
  Administered 2020-06-13: 80 mg via INTRAVENOUS

## 2020-06-13 MED ORDER — SUCCINYLCHOLINE CHLORIDE 20 MG/ML IJ SOLN
INTRAMUSCULAR | Status: DC | PRN
  Administered 2020-06-13: 140 mg via INTRAVENOUS

## 2020-06-13 MED ORDER — LACTATED RINGERS IV SOLN: INTRAVENOUS | Status: DC

## 2020-06-13 MED ORDER — PHENYLEPHRINE HCL-NACL 10-0.9 MG/250ML-% IV SOLN
INTRAVENOUS | Status: DC | PRN
Start: 2020-06-13 — End: 2020-06-13
  Administered 2020-06-13: 40 ug/min via INTRAVENOUS

## 2020-06-13 MED ORDER — MIDAZOLAM HCL 2 MG/2ML IJ SOLN
2.0000 mg | Freq: Once | INTRAMUSCULAR | Status: AC
  Administered 2020-06-13: 2 mg via INTRAVENOUS

## 2020-06-13 MED ORDER — HYDROMORPHONE HCL 1 MG/ML IJ SOLN: 0.2500 mg | INTRAMUSCULAR | Status: DC | PRN

## 2020-06-13 MED ORDER — DEXAMETHASONE SODIUM PHOSPHATE 10 MG/ML IJ SOLN
INTRAMUSCULAR | Status: AC
  Filled 2020-06-13: qty 1

## 2020-06-13 MED ORDER — OXYCODONE-ACETAMINOPHEN 5-325 MG PO TABS: 1.0000 | ORAL_TABLET | ORAL | 0 refills | Status: AC | PRN

## 2020-06-13 MED ORDER — EPHEDRINE SULFATE-NACL 50-0.9 MG/10ML-% IV SOSY
PREFILLED_SYRINGE | INTRAVENOUS | Status: DC | PRN
  Administered 2020-06-13: 10 mg via INTRAVENOUS
  Administered 2020-06-13: 25 mg via INTRAVENOUS

## 2020-06-13 MED ORDER — MEPERIDINE HCL 25 MG/ML IJ SOLN: 6.2500 mg | INTRAMUSCULAR | Status: DC | PRN

## 2020-06-13 MED FILL — oxyCODONE HCL 5 MG TABS: 5 | 4 days supply | Qty: 20 | Fill #0

## 2020-06-13 MED FILL — tiZANidine HCL 2 MG TABS: 2 | 10 days supply | Qty: 30 | Fill #0

## 2020-06-13 SURGICAL SUPPLY — 60 items
ANCHOR PEEK 4.75X19.1 SWLK C (Anchor) ×12 IMPLANT
BENZOIN TINCTURE PRP APPL 2/3 (GAUZE/BANDAGES/DRESSINGS) IMPLANT
BURR OVAL 8 FLU 4.0MM X 13CM (MISCELLANEOUS) ×1
BURR OVAL 8 FLU 4.0X13 (MISCELLANEOUS) ×2 IMPLANT
CANNULA 5.75X7 CRYSTAL CLEAR (CANNULA) ×3 IMPLANT
CANNULA TWIST IN 8.25X7CM (CANNULA) ×3 IMPLANT
CHLORAPREP W/TINT 26 (MISCELLANEOUS) ×3 IMPLANT
CLOSURE WOUND 1/2 X4 (GAUZE/BANDAGES/DRESSINGS)
COVER WAND RF STERILE (DRAPES) IMPLANT
CUTTER BONE 4.0MM X 13CM (MISCELLANEOUS) ×3 IMPLANT
DECANTER SPIKE VIAL GLASS SM (MISCELLANEOUS) IMPLANT
DRAPE IMP U-DRAPE 54X76 (DRAPES) ×3 IMPLANT
DRAPE INCISE IOBAN 66X45 STRL (DRAPES) ×3 IMPLANT
DRAPE STERI 35X30 U-POUCH (DRAPES) ×3 IMPLANT
DRAPE SURG 17X23 STRL (DRAPES) ×3 IMPLANT
DRAPE U-SHAPE 47X51 STRL (DRAPES) ×3 IMPLANT
DRAPE U-SHAPE 76X120 STRL (DRAPES) ×6 IMPLANT
DRSG PAD ABDOMINAL 8X10 ST (GAUZE/BANDAGES/DRESSINGS) ×3 IMPLANT
ELECT REM PT RETURN 9FT ADLT (ELECTROSURGICAL) ×3
ELECTRODE REM PT RTRN 9FT ADLT (ELECTROSURGICAL) ×1 IMPLANT
GAUZE 4X4 16PLY RFD (DISPOSABLE) IMPLANT
GAUZE SPONGE 4X4 12PLY STRL (GAUZE/BANDAGES/DRESSINGS) ×3 IMPLANT
GAUZE XEROFORM 1X8 LF (GAUZE/BANDAGES/DRESSINGS) ×3 IMPLANT
GLOVE BIO SURGEON STRL SZ7 (GLOVE) ×3 IMPLANT
GLOVE BIO SURGEON STRL SZ7.5 (GLOVE) ×3 IMPLANT
GLOVE BIOGEL M 7.0 STRL (GLOVE) ×3 IMPLANT
GLOVE BIOGEL PI IND STRL 7.0 (GLOVE) ×3 IMPLANT
GLOVE BIOGEL PI IND STRL 8 (GLOVE) ×1 IMPLANT
GLOVE BIOGEL PI INDICATOR 7.0 (GLOVE) ×6
GLOVE BIOGEL PI INDICATOR 8 (GLOVE) ×2
GLOVE SURG SS PI 7.0 STRL IVOR (GLOVE) ×3 IMPLANT
GOWN STRL REUS W/ TWL LRG LVL3 (GOWN DISPOSABLE) ×2 IMPLANT
GOWN STRL REUS W/ TWL XL LVL3 (GOWN DISPOSABLE) ×1 IMPLANT
GOWN STRL REUS W/TWL LRG LVL3 (GOWN DISPOSABLE) ×4
GOWN STRL REUS W/TWL XL LVL3 (GOWN DISPOSABLE) ×2
LASSO CRESCENT QUICKPASS (SUTURE) IMPLANT
MANIFOLD NEPTUNE II (INSTRUMENTS) ×3 IMPLANT
NEEDLE SCORPION MULTI FIRE (NEEDLE) ×3 IMPLANT
NS IRRIG 1000ML POUR BTL (IV SOLUTION) IMPLANT
PACK DSU ARTHROSCOPY (CUSTOM PROCEDURE TRAY) ×3 IMPLANT
PROBE BIPOLAR ATHRO 135MM 90D (MISCELLANEOUS) ×3 IMPLANT
RESTRAINT HEAD UNIVERSAL NS (MISCELLANEOUS) ×3 IMPLANT
SET BASIN DAY SURGERY F.S. (CUSTOM PROCEDURE TRAY) ×3 IMPLANT
SLEEVE SCD COMPRESS KNEE MED (MISCELLANEOUS) ×3 IMPLANT
SLING ARM FOAM STRAP LRG (SOFTGOODS) IMPLANT
STRIP CLOSURE SKIN 1/2X4 (GAUZE/BANDAGES/DRESSINGS) IMPLANT
SUPPORT WRAP ARM LG (MISCELLANEOUS) ×3 IMPLANT
SUT ETHILON 3 0 PS 1 (SUTURE) ×3 IMPLANT
SUT FIBERWIRE #2 38 T-5 BLUE (SUTURE)
SUT PDS AB 0 CT 36 (SUTURE) ×3 IMPLANT
SUT TIGER TAPE 7 IN WHITE (SUTURE) ×3 IMPLANT
SUTURE FIBERWR #2 38 T-5 BLUE (SUTURE) IMPLANT
SUTURE TAPE 1.3 40 TPR END (SUTURE) IMPLANT
SUTURETAPE 1.3 40 TPR END (SUTURE)
TAPE FIBER 2MM 7IN #2 BLUE (SUTURE) ×3 IMPLANT
TOWEL GREEN STERILE FF (TOWEL DISPOSABLE) ×3 IMPLANT
TUBE CONNECTING 20'X1/4 (TUBING) ×1
TUBE CONNECTING 20X1/4 (TUBING) ×2 IMPLANT
TUBING ARTHROSCOPY IRRIG 16FT (MISCELLANEOUS) ×3 IMPLANT
WATER STERILE IRR 1000ML POUR (IV SOLUTION) IMPLANT

## 2020-06-13 NOTE — Anesthesia Procedure Notes (Signed)
Anesthesia Regional Block: Interscalene brachial plexus block   Pre-Anesthetic Checklist: ,, timeout performed, Correct Patient, Correct Site, Correct Laterality, Correct Procedure, Correct Position, site marked, Risks and benefits discussed,  Surgical consent,  Pre-op evaluation,  At surgeon's request and post-op pain management  Laterality: Right  Prep: chloraprep       Needles:  Injection technique: Single-shot  Needle Type: Stimiplex     Needle Length: 9cm  Needle Gauge: 21     Additional Needles:   Procedures:,,,, ultrasound used (permanent image in chart),,,,  Narrative:  Start time: 06/13/2020 11:23 AM End time: 06/13/2020 11:28 AM Injection made incrementally with aspirations every 5 mL.  Performed by: Personally  Anesthesiologist: Lynda Rainwater, MD

## 2020-06-13 NOTE — Anesthesia Procedure Notes (Signed)
Procedure Name: Intubation Performed by: Tawni Millers, CRNA Pre-anesthesia Checklist: Patient identified, Emergency Drugs available, Suction available and Patient being monitored Patient Re-evaluated:Patient Re-evaluated prior to induction Oxygen Delivery Method: Circle system utilized Preoxygenation: Pre-oxygenation with 100% oxygen Induction Type: IV induction Ventilation: Mask ventilation without difficulty Laryngoscope Size: Mac and 3 Grade View: Grade III Tube type: Oral Tube size: 7.5 mm Number of attempts: 1 Airway Equipment and Method: Stylet and Oral airway Placement Confirmation: ETT inserted through vocal cords under direct vision,  positive ETCO2 and breath sounds checked- equal and bilateral Tube secured with: Tape Dental Injury: Teeth and Oropharynx as per pre-operative assessment  Difficulty Due To: Difficult Airway- due to large tongue and Difficult Airway- due to limited oral opening

## 2020-06-13 NOTE — Discharge Instructions (Signed)
Discharge Instructions after Arthroscopic Shoulder Repair   A sling has been provided for you. Remain in your sling at all times. This includes sleeping in your sling.  Use ice on the shoulder intermittently over the first 48 hours after surgery.  Pain medicine has been prescribed for you.  Use your medicine liberally over the first 48 hours, and then you can begin to taper your use. You may take Extra Strength Tylenol or Tylenol only in place of the pain pills. DO NOT take ANY nonsteroidal anti-inflammatory pain medications: Advil, Motrin, Ibuprofen, Aleve, Naproxen, or Narprosyn.  You may remove your dressing after two days. If the incision sites are still moist, place a Band-Aid over the moist site(s). Change Band-Aids daily until dry.  You may shower 5 days after surgery. The incisions CANNOT get wet prior to 5 days. Simply allow the water to wash over the site and then pat dry. Do not rub the incisions. Make sure your axilla (armpit) is completely dry after showering.  Take one aspirin a day for 2 weeks after surgery, unless you have an aspirin sensitivity/ allergy or asthma.   Please call [phone] during normal business hours or [phone] after hours for any problems. Including the following:  - excessive redness of the incisions - drainage for more than 4 days - fever of more than 101.5 F  *Please note that pain medications will not be refilled after hours or on weekends.   Post Anesthesia Home Care Instructions  Activity: Get plenty of rest for the remainder of the day. A responsible individual must stay with you for 24 hours following the procedure.  For the next 24 hours, DO NOT: -Drive a car -Operate machinery -Drink alcoholic beverages -Take any medication unless instructed by your physician -Make any legal decisions or sign important papers.  Meals: Start with liquid foods such as gelatin or soup. Progress to regular foods as tolerated. Avoid greasy, spicy, heavy  foods. If nausea and/or vomiting occur, drink only clear liquids until the nausea and/or vomiting subsides. Call your physician if vomiting continues.  Special Instructions/Symptoms: Your throat may feel dry or sore from the anesthesia or the breathing tube placed in your throat during surgery. If this causes discomfort, gargle with warm salt water. The discomfort should disappear within 24 hours.  If you had a scopolamine patch placed behind your ear for the management of post- operative nausea and/or vomiting:  1. The medication in the patch is effective for 72 hours, after which it should be removed.  Wrap patch in a tissue and discard in the trash. Wash hands thoroughly with soap and water. 2. You may remove the patch earlier than 72 hours if you experience unpleasant side effects which may include dry mouth, dizziness or visual disturbances. 3. Avoid touching the patch. Wash your hands with soap and water after contact with the patch.     Regional Anesthesia Blocks  1. Numbness or the inability to move the "blocked" extremity may last from 3-48 hours after placement. The length of time depends on the medication injected and your individual response to the medication. If the numbness is not going away after 48 hours, call your surgeon.  2. The extremity that is blocked will need to be protected until the numbness is gone and the  Strength has returned. Because you cannot feel it, you will need to take extra care to avoid injury. Because it may be weak, you may have difficulty moving it or using it. You may   not know what position it is in without looking at it while the block is in effect.  3. For blocks in the legs and feet, returning to weight bearing and walking needs to be done carefully. You will need to wait until the numbness is entirely gone and the strength has returned. You should be able to move your leg and foot normally before you try and bear weight or walk. You will need someone  to be with you when you first try to ensure you do not fall and possibly risk injury.  4. Bruising and tenderness at the needle site are common side effects and will resolve in a few days.  5. Persistent numbness or new problems with movement should be communicated to the surgeon or the [facility] ([phone])/ [facility] ([phone]).  Information for Discharge Teaching: EXPAREL (bupivacaine liposome injectable suspension)   Your surgeon or anesthesiologist gave you EXPAREL(bupivacaine) to help control your pain after surgery.   EXPAREL is a local anesthetic that provides pain relief by numbing the tissue around the surgical site.  EXPAREL is designed to release pain medication over time and can control pain for up to 72 hours.  Depending on how you respond to EXPAREL, you may require less pain medication during your recovery.  Possible side effects:  Temporary loss of sensation or ability to move in the area where bupivacaine was injected.  Nausea, vomiting, constipation  Rarely, numbness and tingling in your mouth or lips, lightheadedness, or anxiety may occur.  Call your doctor right away if you think you may be experiencing any of these sensations, or if you have other questions regarding possible side effects.  Follow all other discharge instructions given to you by your surgeon or nurse. Eat a healthy diet and drink plenty of water or other fluids.  If you return to the hospital for any reason within 96 hours following the administration of EXPAREL, it is important for health care providers to know that you have received this anesthetic. A teal colored band has been placed on your arm with the date, time and amount of EXPAREL you have received in order to alert and inform your health care providers. Please leave this armband in place for the full 96 hours following administration, and then you may remove the band.    

## 2020-06-13 NOTE — H&P (Signed)
Matthew Mooney. is an 54 y.o. male.   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 Failed conservative treatment.  History reviewed. No pertinent past medical history.  Past Surgical History:  Procedure Laterality Date  . SHOULDER ARTHROSCOPY W/ ROTATOR CUFF REPAIR Left 2019    History reviewed. No pertinent family history. Social History:  reports that he has never smoked. He has never used smokeless tobacco. He reports that he does not drink alcohol and does not use drugs.  Allergies:  Allergies  Allergen Reactions  . Adhesive [Tape] Other (See Comments)    Patient states "it ripped my skin when it came off"    Medications Prior to Admission  Medication Sig Dispense Refill  . aspirin EC 81 MG tablet Take 81 mg by mouth daily. Swallow whole.    . celecoxib (CELEBREX) 100 MG capsule Take 100 mg by mouth 2 (two) times daily.    . cholecalciferol (VITAMIN D3) 25 MCG (1000 UNIT) tablet Take 1,000 Units by mouth daily.    Marland Kitchen FLUoxetine (PROZAC) 40 MG capsule Take 40 mg by mouth daily.    Marland Kitchen MAGNESIUM PO Take by mouth.    . Multiple Vitamin (MULTIVITAMIN ADULT PO) Take by mouth.    . Omega-3 Fatty Acids (FISH OIL OMEGA-3 PO) Take 1 capsule by mouth.    . testosterone (ANDROGEL) 50 MG/5GM (1%) GEL Place 5 g onto the skin daily.    . traMADol (ULTRAM) 50 MG tablet Take 50 mg by mouth every 6 (six) hours as needed.      No results found for this or any previous visit (from the past 48 hour(s)). No results found.  Review of Systems  All other systems reviewed and are negative.   Blood pressure (!) 148/95, pulse 83, temperature 98.1 F (36.7 C), temperature source Oral, resp. rate 15, height 5\' 6"  (1.676 m), weight 92.9 kg, SpO2 100 %. Physical Exam HENT:     Head: Atraumatic.  Eyes:     Extraocular Movements: Extraocular movements intact.  Pulmonary:     Effort: Pulmonary effort is normal.  Musculoskeletal:     Comments: R  shoulder pain with RC testing and impingement. NVID.  Neurological:     Mental Status: He is alert.  Psychiatric:        Mood and Affect: Mood normal.      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 Failed conservative treatment. Plan R arth debride vs repair RC, SAD. Risks / benefits of surgery discussed Consent on chart  NPO for OR Preop antibiotics   Isabella Stalling, MD 06/13/2020, 12:19 PM

## 2020-06-13 NOTE — Progress Notes (Signed)
Assisted Dr. Miller with right, ultrasound guided, interscalene  block. Side rails up, monitors on throughout procedure. See vital signs in flow sheet. Tolerated Procedure well. 

## 2020-06-13 NOTE — Transfer of Care (Signed)
Immediate Anesthesia Transfer of Care Note  Patient: Matthew Mooney.  Procedure(s) Performed: ARTHROSCOPY SHOULDER ROTATOR CUFF REPAIR, DEBRIDEMENT, SUBACROMIAL DECOMPRESSION (Right Shoulder)  Patient Location: PACU  Anesthesia Type:GA combined with regional for post-op pain  Level of Consciousness: drowsy and patient cooperative  Airway & Oxygen Therapy: Patient Spontanous Breathing and Patient connected to face mask oxygen  Post-op Assessment: Report given to RN and Post -op Vital signs reviewed and stable  Post vital signs: Reviewed and stable  Last Vitals:  Vitals Value Taken Time  BP 143/93 06/13/20 1410  Temp 36.9 C 06/13/20 1410  Pulse 110 06/13/20 1412  Resp 18 06/13/20 1412  SpO2 95 % 06/13/20 1412  Vitals shown include unvalidated device data.  Last Pain:  Vitals:   06/13/20 1045  TempSrc: Oral  PainSc: 0-No pain      Patients Stated Pain Goal: 3 (89/21/19 4174)  Complications: No complications documented.

## 2020-06-13 NOTE — Anesthesia Preprocedure Evaluation (Signed)
Anesthesia Evaluation  Patient identified by MRN, date of birth, ID band Patient awake    Reviewed: Allergy & Precautions, NPO status , Patient's Chart, lab work & pertinent test results  Airway Mallampati: II  TM Distance: >3 FB Neck ROM: Full    Dental no notable dental hx.    Pulmonary neg pulmonary ROS,    Pulmonary exam normal breath sounds clear to auscultation       Cardiovascular negative cardio ROS Normal cardiovascular exam Rhythm:Regular Rate:Normal     Neuro/Psych negative neurological ROS  negative psych ROS   GI/Hepatic negative GI ROS, Neg liver ROS,   Endo/Other  negative endocrine ROS  Renal/GU negative Renal ROS  negative genitourinary   Musculoskeletal negative musculoskeletal ROS (+)   Abdominal (+) + obese,   Peds negative pediatric ROS (+)  Hematology negative hematology ROS (+)   Anesthesia Other Findings   Reproductive/Obstetrics negative OB ROS                             Anesthesia Physical Anesthesia Plan  ASA: II  Anesthesia Plan: General   Post-op Pain Management:  Regional for Post-op pain   Induction: Intravenous  PONV Risk Score and Plan: 2 and Ondansetron, Midazolam and Treatment may vary due to age or medical condition  Airway Management Planned: Oral ETT  Additional Equipment:   Intra-op Plan:   Post-operative Plan: Extubation in OR  Informed Consent: I have reviewed the patients History and Physical, chart, labs and discussed the procedure including the risks, benefits and alternatives for the proposed anesthesia with the patient or authorized representative who has indicated his/her understanding and acceptance.     Dental advisory given  Plan Discussed with: CRNA  Anesthesia Plan Comments:         Anesthesia Quick Evaluation

## 2020-06-13 NOTE — Op Note (Signed)
Procedure(s): Procedure Note  Jerred Zaremba. male 54 y.o. 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  Postoperative diagnosis: Same  Procedure(s) and Anesthesia Type: #1 right shoulder arthroscopic rotator cuff repair #2 right shoulder arthroscopic subacromial decompression  Surgeon(s) and Role:    Tania Ade, MD - Primary     Surgeon: Isabella Stalling   Assistants: None  Anesthesia: General endotracheal anesthesia with preoperative interscalene block given by the attending anesthesiologist    Procedure Detail  Estimated Blood Loss: Min         Drains: none  Blood Given: none         Specimens: none        Complications:  * No complications entered in OR log *         Disposition: PACU - hemodynamically stable.         Condition: stable    Procedure:   INDICATIONS FOR SURGERY: The patient is 54 y.o. male who has had several year history of right shoulder pain which has been refractory to nonoperative management.  He had an MRI which showed high-grade partial rotator cuff tearing in 2019.  He went on to have continued pain and problems and ultimately wished to have this surgically addressed.  OPERATIVE FINDINGS: Examination under anesthesia: No stiffness or instability   DESCRIPTION OF PROCEDURE: The patient was identified in preoperative  holding area where I personally marked the operative site after  verifying site, side, and procedure with the patient. An interscalene block was given by the attending anesthesiologist the holding area.  The patient was taken back to the operating room where general anesthesia was induced without complication and was placed in the beach-chair position with the back  elevated about 60 degrees and all extremities and head and neck carefully padded and  positioned.   The right upper extremity was  then prepped and  draped in a standard sterile fashion. The appropriate time-out  procedure was carried out. The patient did receive IV antibiotics  within 30 minutes of incision.   A small posterior portal incision was made and the arthroscope was introduced into the joint. An anterior portal was then established above the subscapularis using needle localization. Small cannula was placed anteriorly. Diagnostic arthroscopy was then carried out.  The subscapularis was noted to be intact.  Glenohumeral joint surfaces were intact without significant chondromalacia.  The biceps tendon and superior labrum were intact.  The undersurface of the rotator cuff appeared largely intact although thinned.  This was lightly debrided.  The arthroscope was then introduced into the subacromial space a standard lateral portal was established with needle localization. The shaver was used through the lateral portal to perform extensive bursectomy. Coracoacromial ligament was examined and found to be frayed indicating chronic impingement.  The bursal surface of the rotator cuff was carefully examined.  Initially the lateral insertion site was felt to be intact.  However upon closer examination nearer to the musculotendinous junction there was a flap which when pulled back showed near complete disruption of the tendinous insertion on the greater tuberosity.  This flap was debrided back to expose the extent of the tear which involves greater than 90% of the insertion of the supraspinatus on the greater tuberosity.  The remaining medial tendon was extremely thin, approximately 1 mm in thickness.  This was opened sharply to complete the tear.  The bur was then used  on the tuberosity to create a bleeding surface to promote healing.  The repair was then carried out by first placing 2 4.75 peek swivel lock anchors just off the articular margin anterior and posterior.  These were loaded with fiber tape and Tiger tape.  These 4  suture strands were then passed evenly throughout the tear and brought over in a crossing suture bridge pattern to 2 additional 4.75 swivel lock anchors bringing the tendon nicely down over the prepared tuberosity.  The repair was felt to be smooth and watertight.  The coracoacromial ligament was taken down off the anterior acromion with the ArthroCare exposing a moderate anterior acromial spur. A high-speed bur was then used through the lateral portal to take down the anterior acromial spur from lateral to medial in a standard acromioplasty.  The acromioplasty was also viewed from the lateral portal and the bur was used as necessary to ensure that the acromion was completely flat from posterior to anterior.  The arthroscopic equipment was removed from the joint and the portals were closed with 3-0 nylon in an interrupted fashion. Sterile dressings were then applied including Xeroform 4 x 4's ABDs and tape. The patient was then allowed to awaken from general anesthesia, placed in a sling, transferred to the stretcher and taken to the recovery room in stable condition.   POSTOPERATIVE PLAN: The patient will be discharged home today and will followup in one week for suture removal and wound check.  He will follow the standard cuff protocol.

## 2020-06-13 NOTE — Anesthesia Postprocedure Evaluation (Signed)
Anesthesia Post Note  Patient: Matthew Mooney.  Procedure(s) Performed: ARTHROSCOPY SHOULDER ROTATOR CUFF REPAIR, DEBRIDEMENT, SUBACROMIAL DECOMPRESSION (Right Shoulder)     Patient location during evaluation: PACU Anesthesia Type: General Level of consciousness: awake and alert Pain management: pain level controlled Vital Signs Assessment: post-procedure vital signs reviewed and stable Respiratory status: spontaneous breathing, nonlabored ventilation and respiratory function stable Cardiovascular status: blood pressure returned to baseline and stable Postop Assessment: no apparent nausea or vomiting Anesthetic complications: no   No complications documented.  Last Vitals:  Vitals:   06/13/20 1450 06/13/20 1500  BP:  109/72  Pulse: (!) 102 100  Resp: (!) 21 18  Temp:    SpO2: 93% 93%    Last Pain:  Vitals:   06/13/20 1500  TempSrc:   PainSc: 0-No pain                 Lynda Rainwater

## 2020-06-14 ENCOUNTER — Encounter (HOSPITAL_BASED_OUTPATIENT_CLINIC_OR_DEPARTMENT_OTHER): Payer: Self-pay | Admitting: Orthopedic Surgery

## 2020-06-22 ENCOUNTER — Encounter (HOSPITAL_BASED_OUTPATIENT_CLINIC_OR_DEPARTMENT_OTHER): Payer: Self-pay | Admitting: Orthopedic Surgery

## 2020-07-13 MED FILL — CELECOXIB 200 MG CAP: 200 | 60 days supply | Qty: 60 | Fill #0

## 2020-07-13 MED FILL — TESTOSTERONE 20.25 MG/ACT (: 20.25 MG/AC | 90 days supply | Qty: 450 | Fill #0

## 2020-07-13 MED FILL — FLUoxetine HCL 20 MG CAPS: 20 | 30 days supply | Qty: 30 | Fill #0

## 2020-07-13 MED FILL — AMITRIPTYLINE HCL 25 MG TAB: 25 | 30 days supply | Qty: 30 | Fill #0

## 2020-08-02 ENCOUNTER — Other Ambulatory Visit (HOSPITAL_COMMUNITY): Payer: Self-pay | Admitting: Family Medicine

## 2020-08-16 MED FILL — FLUoxetine HCL 20 MG CAPS: 20 | 90 days supply | Qty: 90 | Fill #0

## 2020-08-16 MED FILL — AMITRIPTYLINE HCL 25 MG TAB: 25 | 90 days supply | Qty: 90 | Fill #0

## 2020-08-17 MED FILL — TRAMADOL-ACETAMINOPHN 37.5-: 37.5-325 | 45 days supply | Qty: 90 | Fill #0

## 2020-08-30 MED FILL — CELECOXIB 200 MG CAP: 200 | 90 days supply | Qty: 180 | Fill #0

## 2020-10-25 MED FILL — TESTOSTERONE 20.25 MG/ACT (: 20.25 MG/AC | 90 days supply | Qty: 450 | Fill #1

## 2020-10-25 MED FILL — TRAMADOL-ACETAMINOPHN 37.5-: 37.5-325 | 45 days supply | Qty: 90 | Fill #1

## 2020-11-08 MED FILL — AMITRIPTYLINE HCL 25 MG TAB: 25 | 90 days supply | Qty: 90 | Fill #1

## 2020-11-08 MED FILL — FLUoxetine HCL 20 MG CAPS: 20 | 90 days supply | Qty: 90 | Fill #1

## 2021-02-10 ENCOUNTER — Other Ambulatory Visit (HOSPITAL_COMMUNITY): Payer: Self-pay | Admitting: Family Medicine

## 2021-02-10 MED FILL — FLUoxetine HCL 20 MG CAPS: 20 | 90 days supply | Qty: 90 | Fill #2

## 2021-02-10 MED FILL — TRAMADOL-ACETAMINOPHN 37.5-: 37.5-325 | 45 days supply | Qty: 90 | Fill #0

## 2021-02-10 MED FILL — CELECOXIB 200 MG CAP: 200 | 90 days supply | Qty: 180 | Fill #1

## 2021-02-10 MED FILL — AMITRIPTYLINE HCL 25 MG TAB: 25 | 90 days supply | Qty: 90 | Fill #2

## 2021-02-10 MED FILL — TESTOSTERONE 1.62 % GEL: 1.62 | 90 days supply | Qty: 450 | Fill #0

## 2021-03-09 ENCOUNTER — Other Ambulatory Visit (HOSPITAL_BASED_OUTPATIENT_CLINIC_OR_DEPARTMENT_OTHER): Payer: Self-pay

## 2021-05-10 ENCOUNTER — Other Ambulatory Visit (HOSPITAL_COMMUNITY): Payer: Self-pay

## 2021-05-10 MED FILL — Amitriptyline HCl Tab 25 MG: ORAL | 90 days supply | Qty: 90 | Fill #0 | Status: AC

## 2021-05-10 MED FILL — Tramadol-Acetaminophen Tab 37.5-325 MG: ORAL | 45 days supply | Qty: 90 | Fill #0 | Status: AC

## 2021-05-10 MED FILL — Fluoxetine HCl Cap 20 MG: ORAL | 90 days supply | Qty: 90 | Fill #0 | Status: AC

## 2021-05-10 MED FILL — Celecoxib Cap 200 MG: ORAL | 90 days supply | Qty: 180 | Fill #0 | Status: AC

## 2021-05-11 ENCOUNTER — Other Ambulatory Visit (HOSPITAL_COMMUNITY): Payer: Self-pay

## 2021-05-16 ENCOUNTER — Other Ambulatory Visit (HOSPITAL_COMMUNITY): Payer: Self-pay

## 2021-05-17 ENCOUNTER — Other Ambulatory Visit (HOSPITAL_COMMUNITY): Payer: Self-pay

## 2021-05-17 MED ORDER — TESTOSTERONE 20.25 MG/ACT (1.62%) TD GEL
TRANSDERMAL | 0 refills | Status: DC
  Filled 2021-05-17: qty 450, 90d supply, fill #0

## 2021-05-18 ENCOUNTER — Other Ambulatory Visit (HOSPITAL_COMMUNITY): Payer: Self-pay

## 2021-07-10 ENCOUNTER — Other Ambulatory Visit (HOSPITAL_COMMUNITY): Payer: Self-pay

## 2021-07-10 MED ORDER — FLUOROURACIL 5 % EX CREA
TOPICAL_CREAM | CUTANEOUS | 0 refills | Status: DC
  Filled 2021-07-10: qty 40, 14d supply, fill #0

## 2021-07-19 ENCOUNTER — Other Ambulatory Visit: Payer: Self-pay | Admitting: Urology

## 2021-07-19 DIAGNOSIS — N200 Calculus of kidney: Secondary | ICD-10-CM

## 2021-08-14 ENCOUNTER — Other Ambulatory Visit (HOSPITAL_COMMUNITY): Payer: Self-pay

## 2021-08-14 MED ORDER — AMITRIPTYLINE HCL 25 MG PO TABS
25.0000 mg | ORAL_TABLET | Freq: Every evening | ORAL | 3 refills | Status: DC | PRN
  Filled 2021-08-14: qty 90, 90d supply, fill #0
  Filled 2021-11-20: qty 90, 90d supply, fill #1
  Filled 2022-02-19: qty 90, 90d supply, fill #2
  Filled 2022-04-25: qty 90, 90d supply, fill #3

## 2021-08-14 MED ORDER — FLUOXETINE HCL 20 MG PO CAPS
20.0000 mg | ORAL_CAPSULE | Freq: Every day | ORAL | 3 refills | Status: DC
  Filled 2021-08-14: qty 90, 90d supply, fill #0
  Filled 2021-11-20: qty 90, 90d supply, fill #1
  Filled 2022-02-19: qty 90, 90d supply, fill #2

## 2021-08-24 ENCOUNTER — Encounter (HOSPITAL_BASED_OUTPATIENT_CLINIC_OR_DEPARTMENT_OTHER): Payer: Self-pay | Admitting: Urology

## 2021-08-24 NOTE — Progress Notes (Signed)
Pre-operative call completed.  Patient instructed to arrive at 0645.  No NSAIDS, MTV, fish oil, or celebrex starting Friday.  Driver will be his wife Clarene Critchley [phone].

## 2021-08-27 NOTE — H&P (Signed)
Patient is a 55 year old white male with remote history of kidney stones. Presents with approximate 3 month history of intermittent tea-colored urine. Hematuria has been painless denies any nausea vomiting flank pain. He is a nonsmoker. Last episode was approximately 1 week ago. Here for evaluation management for gross painless hematuria. Patient has had normal serum creatinine of 1.3 with a GFR greater than 60 with recent lab work.  Micro urinalysis today was clear on urine spun sediment.  -07/17/21-patient with recent history of gross painless hematuria as above. He has had CT renal scan which shows large irregular right renal calculus with no evidence of obstruction. See report below. Here now to discuss results.  KUB was performed today and shows faintly calcified 1.4 cm stone overlying the right renal shadow. No other obvious stones noted. No obvious bony abnormalities noted.   CLINICAL DATA: Gross hematuria starting 3 months ago, intermittent  since that time. History of renal calculi.   EXAM:  CT ABDOMEN AND PELVIS WITHOUT AND WITH CONTRAST   TECHNIQUE:  Multidetector CT imaging of the abdomen and pelvis was performed  following the standard protocol before and following the bolus  administration of intravenous contrast.   CONTRAST: 85 cc Omnipaque 350   COMPARISON: Abdomen radiographs 10/25/2009 and CT abdomen  09/11/2005   FINDINGS:  Lower chest: Mild linear scarring in the right lower lobe, stable.  Mild cardiomegaly.   Hepatobiliary: Gallbladder unremarkable. Small enhancing 1.0 by 1.1  by 0.7 cm capsular or immediate subcapsular lesion along the  inferior margin of the right hepatic lobe on image 34 series 5 and  image 72 series 604. Presence of this lesion indeterminate on prior  exams due to the lack of IV contrast. Lesion poorly seen both on  precontrast and delayed phase images.   Pancreas: Unremarkable   Spleen: Unremarkable   Adrenals/Urinary Tract: 1.7 by 1.0 by  0.9 cm irregular right kidney  lower pole nonobstructive renal calculus. Low-grade accentuated  enhancement in the right kidney lower pole renal pelvis wall. On  delayed phase images such as images 67 through 73 of series 1000,  there is indistinct filling defect within the right kidney lower  pole collecting system in addition to the stone, favoring blood  products or debris. Tumor is considered substantially less likely  given the lack of enhancement of these filling defects. No other  renal calculi are identified. No renal mass or other filling defect  along the collecting system identified.   Adrenal glands unremarkable. Urinary bladder unremarkable.   Stomach/Bowel: 0.6 cm metal density structure in the distal  transverse colon on image 32 series 5, potentially an ingested item  in the fecal stream or an endoscopically placed clip.   Vascular/Lymphatic: Unremarkable   Reproductive: Unremarkable   Other: No supplemental non-categorized findings.   Musculoskeletal: Mild lower lumbar degenerative facet arthropathy.   IMPRESSION:  1. Large nonobstructive irregular renal calculus of the right kidney  lower pole. Surrounding small scattered filling defects in the right  kidney lower pole collecting system probably representing blood  products. Subtle enhancement in the wall of the right kidney lower  pole collecting system is thin and circumferential and probably  inflammatory.  2. Small enhancing 1.1 cm in long axis peripheral lesion in the  right hepatic lobe may well represent a small hemangioma or similar  benign lesion but is technically nonspecific. Consider hepatic  protocol MRI with and without contrast to further assess, especially  if the patient has a history of abnormal  liver enzymes or a history  of gastrointestinal malignancy.    Electronically Signed  By: Van Clines M.D.  On: 07/08/2021 18:04     ALLERGIES: No Allergies    MEDICATIONS: Androgel   Amitriptyline Hcl  Celebrex  Cyclobenzaprine HCl - 10 MG Oral Tablet 3 Oral  Diclofenac  Magnesium  PROzac 20 MG Oral Capsule Oral  Tramadol Hcl 50 mg tablet  Vitamin B Complex  Vitamin D2  Zinc     GU PSH: Locm 300-'399Mg'$ /Ml Iodine,1Ml - 07/07/2021 Vasectomy - 2010       PSH Notes: Tonsillectomy, Surgery Of Male Genitalia Vasectomy   NON-GU PSH: Remove Tonsils - 2010 Rotator cuff surgery     GU PMH: Gross hematuria - 07/07/2021, - 06/29/2021 History of urolithiasis, Nephrolithiasis - 2014 Low back pain, Lumbago - 2014      PMH Notes:  1898-12-17 00:00:00 - Note: Normal Routine History And Physical Adult   NON-GU PMH: Personal history of other mental and behavioral disorders, History of depression - 2014 Depression    FAMILY HISTORY: Family Health Status - Father alive at age 68 - 50 In Family Family Health Status - Mother's Age - Runs In Family Family Health Status Number - Runs In Family Hypertension - Mother, Father   SOCIAL HISTORY: Marital Status: Married Preferred Language: English; Ethnicity: Not Hispanic Or Latino; Race: White Current Smoking Status: Patient has never smoked.   Tobacco Use Assessment Completed: Used Tobacco in last 30 days? Does not use smokeless tobacco. Does not use drugs. Drinks 2 caffeinated drinks per day. Has not had a blood transfusion.     Notes: Tobacco Use, Caffeine Use, Marital History - Currently Married, Being A Economist, Occupation:   REVIEW OF SYSTEMS:    GU Review Male:   Patient denies frequent urination, hard to postpone urination, burning/ pain with urination, get up at night to urinate, leakage of urine, stream starts and stops, trouble starting your stream, have to strain to urinate , erection problems, and penile pain.  Gastrointestinal (Upper):   Patient denies nausea, vomiting, and indigestion/ heartburn.  Gastrointestinal (Lower):   Patient denies diarrhea and constipation.  Constitutional:   Patient  reports weight loss. Patient denies fever, night sweats, and fatigue.  Skin:   Patient denies skin rash/ lesion and itching.  Eyes:   Patient denies blurred vision and double vision.  Ears/ Nose/ Throat:   Patient denies sore throat and sinus problems.  Hematologic/Lymphatic:   Patient denies swollen glands and easy bruising.  Cardiovascular:   Patient denies leg swelling and chest pains.  Respiratory:   Patient denies cough and shortness of breath.  Endocrine:   Patient denies excessive thirst.  Musculoskeletal:   Patient reports back pain and joint pain.   Neurological:   Patient reports dizziness. Patient denies headaches.  Psychologic:   Patient denies depression and anxiety.   VITAL SIGNS: None   MULTI-SYSTEM PHYSICAL EXAMINATION:    Constitutional: Well-nourished. No physical deformities. Normally developed. Good grooming.  Neck: Neck symmetrical, not swollen. Normal tracheal position.  Respiratory: No labored breathing, no use of accessory muscles.   Cardiovascular: Normal temperature, normal extremity pulses, no swelling, no varicosities.  Lymphatic: No enlargement of neck, axillae, groin.  Skin: No paleness, no jaundice, no cyanosis. No lesion, no ulcer, no rash.  Neurologic / Psychiatric: Oriented to time, oriented to place, oriented to person. No depression, no anxiety, no agitation.  Eyes: Normal conjunctivae. Normal eyelids.  Ears, Nose, Mouth, and Throat: Left ear no scars,  no lesions, no masses. Right ear no scars, no lesions, no masses. Nose no scars, no lesions, no masses. Normal hearing. Normal lips.  Musculoskeletal: Normal gait and station of head and neck.     Complexity of Data:  Source Of History:  Patient  Records Review:   Previous Doctor Records, Previous Patient Records  Urine Test Review:   Urinalysis  X-Ray Review: C.T. Abdomen/Pelvis: Reviewed Films. Reviewed Report. Discussed With Patient.     PROCEDURES:         KUB - [id number]  A single view of the  abdomen is obtained.      Patient confirmed No Neulasta OnPro Device. KUB is reviewed today shows no obvious bony abnormalities. There is a approximate cm and half think we calcified stone overlying the right renal shadow. No obvious ureteral calculi noted.          Urinalysis w/Scope - 81001 Dipstick Dipstick Cont'd Micro  Color: Yellow Bilirubin: Neg mg/dL WBC/hpf: NS (Not Seen)  Appearance: Clear Ketones: Neg mg/dL RBC/hpf: NS (Not Seen)  Specific Gravity: 1.025 Blood: Neg ery/uL Bacteria: Rare (0-9/hpf)  pH: 6.0 Protein: 1+ mg/dL Cystals: NS (Not Seen)  Glucose: Neg mg/dL Urobilinogen: 0.2 mg/dL Casts: NS (Not Seen)    Nitrites: Neg Trichomonas: Not Present    Leukocyte Esterase: Neg leu/uL Mucous: Not Present      Epithelial Cells: NS (Not Seen)      Yeast: NS (Not Seen)      Sperm: Not Present    Notes: QNS for spun micro    ASSESSMENT:      ICD-10 Details  1 GU:   Gross hematuria - 123456 Acute, Complicated Injury  2   Renal calculus - 0000000 Acute, Complicated Injury   PLAN:           Orders X-Rays: KUB          Schedule         Document Letter(s):  Created for Patient: Clinical Summary         Notes:   I discussed CT findings and reviewed the CT scan in detail with patient. As this is the likely source of hematuria will go ahead and treat stone 1st and see if hematuria resolves. Patient agreeable with this. We discussed treatment options and after discussion of ESL versus ureteroscopy and laser lithotripsy has opted for ESL. Risks and benefits discussed as outlined below. Will schedule accordingly in the near future.  I have discussed with the patient the risks and consequences of the procedure of extracorporeal shockwave lithotripsy to include, but not limited to: Bleeding, including bleeding in the urine, bleeding around the kidney with hematoma formation and rarely bleeding to the point of loss of the kidney, infection, damage to the surrounding structures  including soft tissue and bowel perforation, residual stone fragments requiring the need for future treatments including endoscopic surgery, open surgery or percutaneous surgery. I have emphasized that study showed that up to 25% of patients will require additional procedures depending on stone size and composition. I have also discussed with the patient that the success rate for ESWL is approximately 60-90% and depends on stone location and stone composition as well as preoperative stone size. The patient voices understanding of the risks and benefits of the above and consents to the procedure

## 2021-08-28 ENCOUNTER — Other Ambulatory Visit (HOSPITAL_COMMUNITY): Payer: Self-pay

## 2021-08-28 ENCOUNTER — Ambulatory Visit (HOSPITAL_BASED_OUTPATIENT_CLINIC_OR_DEPARTMENT_OTHER)
Admission: RE | Admit: 2021-08-28 | Discharge: 2021-08-28 | Disposition: A | Discharge status: Home / Self Care | Payer: No Typology Code available for payment source | Attending: Urology | Admitting: Urology

## 2021-08-28 ENCOUNTER — Ambulatory Visit (HOSPITAL_COMMUNITY): Payer: No Typology Code available for payment source

## 2021-08-28 ENCOUNTER — Other Ambulatory Visit: Payer: Self-pay

## 2021-08-28 ENCOUNTER — Encounter (HOSPITAL_BASED_OUTPATIENT_CLINIC_OR_DEPARTMENT_OTHER)
Admission: RE | Disposition: A | Discharge status: Home / Self Care | Payer: Self-pay | Source: Home / Self Care | Attending: Urology

## 2021-08-28 ENCOUNTER — Encounter (HOSPITAL_BASED_OUTPATIENT_CLINIC_OR_DEPARTMENT_OTHER): Payer: Self-pay | Admitting: Urology

## 2021-08-28 DIAGNOSIS — N200 Calculus of kidney: Secondary | ICD-10-CM | POA: Diagnosis not present

## 2021-08-28 DIAGNOSIS — Z791 Long term (current) use of non-steroidal anti-inflammatories (NSAID): Secondary | ICD-10-CM | POA: Diagnosis not present

## 2021-08-28 DIAGNOSIS — Z87442 Personal history of urinary calculi: Secondary | ICD-10-CM | POA: Insufficient documentation

## 2021-08-28 DIAGNOSIS — Z79899 Other long term (current) drug therapy: Secondary | ICD-10-CM | POA: Diagnosis not present

## 2021-08-28 DIAGNOSIS — Z8249 Family history of ischemic heart disease and other diseases of the circulatory system: Secondary | ICD-10-CM | POA: Insufficient documentation

## 2021-08-28 HISTORY — PX: EXTRACORPOREAL SHOCK WAVE LITHOTRIPSY: SHX1557

## 2021-08-28 HISTORY — DX: Anxiety disorder, unspecified: F41.9

## 2021-08-28 HISTORY — DX: Unspecified osteoarthritis, unspecified site: M19.90

## 2021-08-28 HISTORY — DX: Personal history of urinary calculi: Z87.442

## 2021-08-28 HISTORY — DX: Depression, unspecified: F32.A

## 2021-08-28 SURGERY — LITHOTRIPSY, ESWL
Anesthesia: LOCAL | Laterality: Right

## 2021-08-28 MED ORDER — CIPROFLOXACIN HCL 500 MG PO TABS
ORAL_TABLET | ORAL | Status: AC
  Filled 2021-08-28: qty 1

## 2021-08-28 MED ORDER — SODIUM CHLORIDE 0.9 % IV SOLN: INTRAVENOUS | Status: DC

## 2021-08-28 MED ORDER — OXYCODONE-ACETAMINOPHEN 5-325 MG PO TABS
1.0000 | ORAL_TABLET | ORAL | 0 refills | Status: AC | PRN
  Filled 2021-08-28: qty 20, 4d supply, fill #0

## 2021-08-28 MED ORDER — DIAZEPAM 5 MG PO TABS
ORAL_TABLET | ORAL | Status: AC
  Filled 2021-08-28: qty 2

## 2021-08-28 MED ORDER — DIPHENHYDRAMINE HCL 25 MG PO CAPS
25.0000 mg | ORAL_CAPSULE | ORAL | Status: AC
  Administered 2021-08-28: 25 mg via ORAL

## 2021-08-28 MED ORDER — DIAZEPAM 5 MG PO TABS
10.0000 mg | ORAL_TABLET | ORAL | Status: AC
  Administered 2021-08-28: 10 mg via ORAL

## 2021-08-28 MED ORDER — DIPHENHYDRAMINE HCL 25 MG PO CAPS
ORAL_CAPSULE | ORAL | Status: AC
  Filled 2021-08-28: qty 1

## 2021-08-28 MED ORDER — TAMSULOSIN HCL 0.4 MG PO CAPS
0.4000 mg | ORAL_CAPSULE | Freq: Every day | ORAL | 0 refills | Status: AC
  Filled 2021-08-28: qty 30, 30d supply, fill #0

## 2021-08-28 MED ORDER — CIPROFLOXACIN HCL 500 MG PO TABS
500.0000 mg | ORAL_TABLET | ORAL | Status: AC
  Administered 2021-08-28: 500 mg via ORAL

## 2021-08-28 NOTE — Interval H&P Note (Signed)
History and Physical Interval Note:  08/28/2021 8:53 AM  Matthew Mooney.  has presented today for surgery, with the diagnosis of RIGHT RENAL CALCULUS.  The various methods of treatment have been discussed with the patient and family. After consideration of risks, benefits and other options for treatment, the patient has consented to  Procedure(s): EXTRACORPOREAL SHOCK WAVE LITHOTRIPSY (ESWL) (Right) as a surgical intervention.  The patient's history has been reviewed, patient examined, no change in status, stable for surgery.  I have reviewed the patient's chart and labs.  Questions were answered to the patient's satisfaction.     [name]

## 2021-08-28 NOTE — Brief Op Note (Signed)
08/28/2021  9:31 AM  PATIENT:  Matthew Mooney.  55 y.o. male  PRE-OPERATIVE DIAGNOSIS:  RIGHT RENAL CALCULUS  POST-OPERATIVE DIAGNOSIS:  SAME  PROCEDURE:  Procedure(s): EXTRACORPOREAL SHOCK WAVE LITHOTRIPSY (ESWL) (Right)  SURGEON:  Surgeon(s) and Role:    * [name], MD - Primary  PHYSICIAN ASSISTANT:   ASSISTANTS: none   ANESTHESIA:   IV sedation  EBL:  minimal   BLOOD ADMINISTERED:none  DRAINS: none   LOCAL MEDICATIONS USED:  NONE  SPECIMEN:  No Specimen  DISPOSITION OF SPECIMEN:  N/A  COUNTS:  YES  TOURNIQUET:  * No tourniquets in log *  DICTATION: .Note written in EPIC  PLAN OF CARE: Discharge to home after PACU  PATIENT DISPOSITION:  PACU - hemodynamically stable.   Delay start of Pharmacological VTE agent (>24hrs) due to surgical blood loss or risk of bleeding: not applicable

## 2021-08-28 NOTE — Op Note (Signed)
08/28/2021   9:31 AM   PATIENT:  Matthew Mooney.  55 y.o. male   PRE-OPERATIVE DIAGNOSIS:  RIGHT RENAL CALCULUS   POST-OPERATIVE DIAGNOSIS:  SAME   PROCEDURE:  Procedure(s): EXTRACORPOREAL SHOCK WAVE LITHOTRIPSY (ESWL) (Right)   SURGEON:  Surgeon(s) and Role:    * [name], MD - Primary   PHYSICIAN ASSISTANT:    ASSISTANTS: none    ANESTHESIA:   IV sedation   EBL:  minimal    BLOOD ADMINISTERED:none   DRAINS: none    LOCAL MEDICATIONS USED:  NONE   SPECIMEN:  No Specimen   DISPOSITION OF SPECIMEN:  N/A   COUNTS:  YES   TOURNIQUET:  * No tourniquets in log *   DICTATION: .Note written in EPIC   PLAN OF CARE: Discharge to home after PACU   PATIENT DISPOSITION:  PACU - hemodynamically stable.   Delay start of Pharmacological VTE agent (>24hrs) due to surgical blood loss or risk of bleeding: not applicable

## 2021-08-29 ENCOUNTER — Encounter (HOSPITAL_BASED_OUTPATIENT_CLINIC_OR_DEPARTMENT_OTHER): Payer: Self-pay | Admitting: Urology

## 2021-09-18 ENCOUNTER — Other Ambulatory Visit (HOSPITAL_COMMUNITY): Payer: Self-pay

## 2021-09-18 MED ORDER — CELECOXIB 200 MG PO CAPS
200.0000 mg | ORAL_CAPSULE | Freq: Two times a day (BID) | ORAL | 1 refills | Status: DC
  Filled 2021-09-18: qty 180, 90d supply, fill #0
  Filled 2021-12-19: qty 180, 90d supply, fill #1

## 2021-09-18 MED ORDER — TRAMADOL-ACETAMINOPHEN 37.5-325 MG PO TABS
ORAL_TABLET | ORAL | 1 refills | Status: DC
  Filled 2021-09-18: qty 90, 45d supply, fill #0
  Filled 2021-11-20: qty 90, 45d supply, fill #1

## 2021-09-18 MED ORDER — TESTOSTERONE 20.25 MG/ACT (1.62%) TD GEL
TRANSDERMAL | 2 refills | Status: DC
  Filled 2021-09-18: qty 450, 90d supply, fill #0
  Filled 2022-01-08: qty 450, 90d supply, fill #1

## 2021-09-19 ENCOUNTER — Other Ambulatory Visit (HOSPITAL_COMMUNITY): Payer: Self-pay

## 2021-09-21 ENCOUNTER — Other Ambulatory Visit (HOSPITAL_COMMUNITY): Payer: Self-pay

## 2021-11-20 ENCOUNTER — Other Ambulatory Visit (HOSPITAL_COMMUNITY): Payer: Self-pay

## 2021-11-22 ENCOUNTER — Other Ambulatory Visit (HOSPITAL_COMMUNITY): Payer: Self-pay

## 2021-12-20 ENCOUNTER — Other Ambulatory Visit (HOSPITAL_COMMUNITY): Payer: Self-pay

## 2022-01-08 ENCOUNTER — Other Ambulatory Visit (HOSPITAL_COMMUNITY): Payer: Self-pay

## 2022-01-09 ENCOUNTER — Other Ambulatory Visit (HOSPITAL_COMMUNITY): Payer: Self-pay

## 2022-02-03 ENCOUNTER — Other Ambulatory Visit: Payer: Self-pay

## 2022-02-03 ENCOUNTER — Emergency Department (HOSPITAL_BASED_OUTPATIENT_CLINIC_OR_DEPARTMENT_OTHER)
Admission: EM | Admit: 2022-02-03 | Discharge: 2022-02-04 | Disposition: A | Discharge status: Home / Self Care | Payer: No Typology Code available for payment source | Attending: Emergency Medicine | Admitting: Emergency Medicine

## 2022-02-03 ENCOUNTER — Emergency Department (HOSPITAL_BASED_OUTPATIENT_CLINIC_OR_DEPARTMENT_OTHER): Payer: No Typology Code available for payment source

## 2022-02-03 DIAGNOSIS — Z7982 Long term (current) use of aspirin: Secondary | ICD-10-CM | POA: Diagnosis not present

## 2022-02-03 DIAGNOSIS — R200 Anesthesia of skin: Secondary | ICD-10-CM | POA: Diagnosis present

## 2022-02-03 DIAGNOSIS — Z7901 Long term (current) use of anticoagulants: Secondary | ICD-10-CM | POA: Insufficient documentation

## 2022-02-03 DIAGNOSIS — R531 Weakness: Secondary | ICD-10-CM | POA: Diagnosis not present

## 2022-02-03 DIAGNOSIS — Z20822 Contact with and (suspected) exposure to covid-19: Secondary | ICD-10-CM | POA: Diagnosis not present

## 2022-02-03 DIAGNOSIS — Z79899 Other long term (current) drug therapy: Secondary | ICD-10-CM | POA: Diagnosis not present

## 2022-02-03 NOTE — ED Triage Notes (Signed)
Pt is currently taking antibiotic and steroid for sinus infection.

## 2022-02-03 NOTE — ED Notes (Signed)
Patient transported to CT 

## 2022-02-03 NOTE — ED Provider Notes (Signed)
[facility] EMERGENCY DEPT Provider Note  CSN: [id number] Arrival date & time: 02/03/22 2210  Chief Complaint(s) Numbness  HPI Matthew Mooney. is a 56 y.o. male with a past medical history listed below here for 3 episodes of right-sided numbness and weakness.  Episodes occurred 2 hours apart from each other and lasted approximately 5 to 10 minutes each.  Numbness included the face, right upper and lower extremity.  Patient also reported weird taste in his mouth.  He also noted difficulty ambulating during the first episode due to the numbness in the right leg .  Patient also noted weakness to the right upper extremity. No visual disturbance.  No dysarthria or aphasia.  No headache.  No chest pain or shortness of breath.  No palpitations or tachycardia.  Last episode was approximately 4 hours ago.  Has been asymptomatic since.  Patient is currently being treated for persistent sinusitis with antibiotic and steroids started in the last several days.  The history is provided by the patient.   Past Medical History Past Medical History:  Diagnosis Date   Anxiety    Arthritis    Depression    History of kidney stones    There are no problems to display for this patient.  Home Medication(s) Prior to Admission medications   Medication Sig Start Date End Date Taking? Authorizing Provider  amitriptyline (ELAVIL) 25 MG tablet Take 1 tablet (25 mg total) by mouth at bedtime as needed for sleep 08/14/21     amitriptyline (ELAVIL) 25 MG tablet TAKE 1 TABLET BY MOUTH AT BEDTIME AS NEEDED FOR SLEEP *NEEDS OFFICE VISIT FOR MORE REFILLS 08/02/20 08/15/21  Mateo Flow, MD  aspirin EC 81 MG tablet Take 81 mg by mouth daily. Swallow whole.    [provider]  celecoxib (CELEBREX) 100 MG capsule Take 100 mg by mouth 2 (two) times daily.    [provider]  celecoxib (CELEBREX) 200 MG capsule Take 1 capsule (200 mg total) by mouth 2 (two) times daily for arthritis  09/18/21     cholecalciferol (VITAMIN D3) 25 MCG (1000 UNIT) tablet Take 1,000 Units by mouth daily.    [provider]  fluorouracil (EFUDEX) 5 % cream Apply 1 application on the skin twice a day for 2 weeks. 07/10/21     FLUoxetine (PROZAC) 20 MG capsule Take 1 capsule (20 mg total) by mouth daily.  Need office visit 08/14/21     FLUoxetine (PROZAC) 20 MG capsule TAKE 1 CAPSULE BY MOUTH DAILY * NEEDS OFFICE VISIT FOR MORE REFILLS 08/02/20 08/15/21  Mateo Flow, MD  FLUoxetine (PROZAC) 40 MG capsule Take 40 mg by mouth daily.    [provider]  MAGNESIUM PO Take by mouth.    [provider]  Multiple Vitamin (MULTIVITAMIN ADULT PO) Take by mouth.    [provider]  Omega-3 Fatty Acids (FISH OIL OMEGA-3 PO) Take 1 capsule by mouth.    [provider]  oxyCODONE-acetaminophen (PERCOCET) 5-325 MG tablet Take 1 tablet by mouth every 4 (four) hours as needed for severe pain. 08/28/21 08/28/22  [name], MD  testosterone (ANDROGEL) 50 MG/5GM (1%) GEL Place 5 g onto the skin daily.    [provider]  Testosterone 1.62 % GEL APPLY 4 PUMPS ONTO SKIN ONCE A DAY 02/10/21 08/09/21  Serita Grammes, MD  Testosterone 20.25 MG/ACT (1.62%) GEL Apply 4 Pump on skin daily 05/17/21     Testosterone 20.25 MG/ACT (1.62%) GEL Apply 4  pumps on skin daily 09/18/21     traMADol (ULTRAM) 50 MG tablet Take 50 mg by mouth every 6 (six) hours as needed.    [provider]  traMADol-acetaminophen (ULTRACET) 37.5-325 MG tablet Take 1-2 tablets by mouth at bedtime as needed for pain 09/18/21                                                                                                                                       Allergies Adhesive [tape]  Review of Systems Review of Systems As noted in HPI  Physical Exam Vital Signs  I have reviewed the triage vital signs BP 136/75    Pulse 83    Temp 98.3 F (36.8 C)    Resp 15    Ht 5\' 6"  (1.676 m)    Wt  90.7 kg    SpO2 98%    BMI 32.28 kg/m   Physical Exam  ED Results and Treatments Labs (all labs ordered are listed, but only abnormal results are displayed) Labs Reviewed  CBC - Abnormal; Notable for the following components:      Result Value   WBC 12.1 (*)    All other components within normal limits  DIFFERENTIAL - Abnormal; Notable for the following components:   Monocytes Absolute 1.2 (*)    Abs Immature Granulocytes 0.08 (*)    All other components within normal limits  COMPREHENSIVE METABOLIC PANEL - Abnormal; Notable for the following components:   BUN 29 (*)    Total Protein 6.3 (*)    All other components within normal limits  RESP PANEL BY RT-PCR (FLU A&B, COVID) ARPGX2  ETHANOL  PROTIME-INR  APTT  RAPID URINE DRUG SCREEN, HOSP PERFORMED  URINALYSIS, ROUTINE W REFLEX MICROSCOPIC                                                                                                                         EKG  EKG Interpretation  Date/Time:  Saturday February 03 2022 22:26:10 EST Ventricular Rate:  82 PR Interval:  137 QRS Duration: 90 QT Interval:  337 QTC Calculation: 394 R Axis:   53 Text Interpretation: Sinus rhythm Borderline repolarization abnormality Baseline wander in lead(s) V1 Confirmed by Addison Lank [id number]) on 02/04/2022 12:27:23 AM       Radiology CT ANGIO HEAD NECK W WO CM  Result Date: 02/04/2022 CLINICAL DATA:  Initial  evaluation for acute headache, right-sided numbness. EXAM: CT ANGIOGRAPHY HEAD AND NECK TECHNIQUE: Multidetector CT imaging of the head and neck was performed using the standard protocol during bolus administration of intravenous contrast. Multiplanar CT image reconstructions and MIPs were obtained to evaluate the vascular anatomy. Carotid stenosis measurements (when applicable) are obtained utilizing NASCET criteria, using the distal internal carotid diameter as the denominator. RADIATION DOSE REDUCTION: This exam was performed  according to the departmental dose-optimization program which includes automated exposure control, adjustment of the mA and/or kV according to patient size and/or use of iterative reconstruction technique. CONTRAST:  49mL OMNIPAQUE IOHEXOL 350 MG/ML SOLN COMPARISON:  Head CT from 02/03/2022. FINDINGS: CTA NECK FINDINGS Aortic arch: Visualized aortic arch normal caliber with normal branch pattern. No stenosis about the origin of the great vessels. Right carotid system: Right common and internal carotid arteries widely patent without stenosis, dissection or occlusion. Left carotid system: Left common and internal carotid arteries widely patent without stenosis, dissection or occlusion. Vertebral arteries: Both vertebral arteries arise from the subclavian arteries. No proximal subclavian artery stenosis. Both vertebral arteries widely patent without stenosis, dissection or occlusion. Skeleton: No discrete or worrisome osseous lesions. Other neck: No other acute soft tissue abnormality within the neck. Upper chest: Scattered atelectatic changes noted within the visualized lungs. Visualized upper chest demonstrates no other acute finding. Review of the MIP images confirms the above findings CTA HEAD FINDINGS Anterior circulation: Both internal carotid arteries widely patent to the termini without stenosis. A1 segments widely patent. Normal anterior communicating artery complex. Both anterior cerebral arteries widely patent to their distal aspects without stenosis. No M1 stenosis or occlusion. Normal MCA bifurcations. Distal MCA branches well perfused and symmetric. Posterior circulation: Both vertebral arteries patent to the vertebrobasilar junction without stenosis. Both PICA patent. Basilar patent to its distal aspect without stenosis. Superior cerebellar arteries patent bilaterally. Both PCAs primarily supplied via the basilar. PCAs mildly irregular with associated mild to moderate bilateral P2 stenoses. PCAs remain  patent to their distal aspects. Venous sinuses: Patent allowing for timing the contrast bolus. Anatomic variants: None significant.  No aneurysm. Review of the MIP images confirms the above findings IMPRESSION: 1. Negative CTA for large vessel occlusion. 2. 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 3. Otherwise negative CTA of the head and neck. No other hemodynamically significant or correctable stenosis. No other acute vascular abnormality identified. Electronically Signed   By: Jeannine Boga M.D.   On: 02/04/2022 02:59   CT HEAD WO CONTRAST  Result Date: 02/03/2022 CLINICAL DATA:  Right-sided numbness, intermittent beginning this afternoon. EXAM: CT HEAD WITHOUT CONTRAST TECHNIQUE: Contiguous axial images were obtained from the base of the skull through the vertex without intravenous contrast. RADIATION DOSE REDUCTION: This exam was performed according to the departmental dose-optimization program which includes automated exposure control, adjustment of the mA and/or kV according to patient size and/or use of iterative reconstruction technique. COMPARISON:  MRI 12/30/2006 FINDINGS: Brain: The brain shows a normal appearance without evidence of malformation, atrophy, old or acute small or large vessel infarction, mass lesion, hemorrhage, hydrocephalus or extra-axial collection. Vascular: No hyperdense vessel. No evidence of atherosclerotic calcification. Skull: Normal.  No traumatic finding.  No focal bone lesion. Sinuses/Orbits: Sinuses are clear. Orbits appear normal except for an old medial wall blowout fracture on the right. Mastoids are clear. Other: None significant IMPRESSION: Normal head CT. Electronically Signed   By: Nelson Chimes M.D.   On: 02/03/2022 23:54  Pertinent labs & imaging results that were available during my care of the patient were reviewed by me and considered in my medical decision making (see MDM for  details).  Medications Ordered in ED Medications  iohexol (OMNIPAQUE) 350 MG/ML injection 100 mL (75 mLs Intravenous Contrast Given 02/04/22 0149)                                                                                                                                     Procedures Procedures  (including critical care time)  Medical Decision Making / ED Course        Right-sided numbness and weakness Most suspicious for TIA vs partial seizure.  Other considerations include complex migraine, tachydysrhythmia (though less likely), prednisone side effect.  Will assess for any electrolyte derangements.  Work-up ordered to assess concerns above.  Labs and imaging independently interpreted by me and noted below: CBC with leukocytosis likely from steroid use.  No anemia No significant electrolyte derangements or renal sufficiency  CT head without ICH or obvious remote infarcts.  Interpreted by radiologist who confirmed. CTA head and neck without significant stenosis.  Confirmed by radiology who did note evidence of bilateral PCAs(P2) irregular contour mild to moderate stenosis.  Management: Continuous telemetry monitoring notable for normal sinus rhythm with heart rates in the 70s to 80s.  No dysrhythmias or blocks. Consulted neurology and spoke with Dr. Lorrin Goodell. He recommended CTA to assess for stenosis. If negative or mild disease, patient can follow up outpatient with 81mg  ASA.  If there is significant stenosis, I recommended admission to the hospital. Discussed the CTA findings with neurology who felt patient was appropriate for outpatient management  Reassessment: Patient remained hemodynamically stable without any recurrence of symptoms.    Final Clinical Impression(s) / ED Diagnoses Final diagnoses:  Right sided numbness   The patient appears reasonably screened and/or stabilized for discharge and I doubt any other medical condition or other [facility] requiring further  screening, evaluation, or treatment in the ED at this time prior to discharge. Safe for discharge with strict return precautions.  Disposition: Discharge  Condition: Good  I have discussed the results, Dx and Tx plan with the patient/family who expressed understanding and agree(s) with the plan. Discharge instructions discussed at length. The patient/family was given strict return precautions who verbalized understanding of the instructions. No further questions at time of discharge.    ED Discharge Orders     None        Follow Up: Mateo Flow, MD [place] [address] [phone]  Call    [facility] [address]     [place] [phone] Call  to schedule an appointment for close follow up           This chart was dictated using voice recognition software.  Despite best efforts to proofread,  errors can occur which  can change the documentation meaning.    Fatima Blank, MD 02/04/22 [phone]

## 2022-02-03 NOTE — ED Triage Notes (Signed)
Right sided numbness on and off since 3pm today. Denies any numbness or pain at this current time.

## 2022-02-04 ENCOUNTER — Emergency Department (HOSPITAL_BASED_OUTPATIENT_CLINIC_OR_DEPARTMENT_OTHER): Payer: No Typology Code available for payment source

## 2022-02-04 LAB — COMPREHENSIVE METABOLIC PANEL
ALT: 33 U/L (ref 0–44)
AST: 17 U/L (ref 15–41)
Albumin: 4.4 g/dL (ref 3.5–5.0)
Alkaline Phosphatase: 63 U/L (ref 38–126)
Anion gap: 10 (ref 5–15)
BUN: 29 mg/dL — ABNORMAL HIGH (ref 6–20)
CO2: 26 mmol/L (ref 22–32)
Calcium: 9.3 mg/dL (ref 8.9–10.3)
Chloride: 103 mmol/L (ref 98–111)
Creatinine, Ser: 0.98 mg/dL (ref 0.61–1.24)
GFR, Estimated: >60 mL/min (ref >60)
Glucose, Bld: 93 mg/dL (ref 70–99)
Potassium: 4 mmol/L (ref 3.5–5.1)
Sodium: 139 mmol/L (ref 135–145)
Total Bilirubin: 0.3 mg/dL (ref 0.3–1.2)
Total Protein: 6.3 g/dL — ABNORMAL LOW (ref 6.5–8.1)

## 2022-02-04 LAB — PROTIME-INR
INR: 0.9 (ref 0.8–1.2)
Prothrombin Time: 11.9 seconds (ref 11.4–15.2)

## 2022-02-04 LAB — URINALYSIS, ROUTINE W REFLEX MICROSCOPIC
Bilirubin Urine: NEGATIVE
Glucose, UA: NEGATIVE mg/dL
Hgb urine dipstick: NEGATIVE
Ketones, ur: NEGATIVE mg/dL
Leukocytes,Ua: NEGATIVE
Nitrite: NEGATIVE
Protein, ur: NEGATIVE mg/dL
Specific Gravity, Urine: 1.025 (ref 1.005–1.030)
pH: 6 (ref 5.0–8.0)

## 2022-02-04 LAB — DIFFERENTIAL
Abs Immature Granulocytes: 0.08 10*3/uL — ABNORMAL HIGH (ref 0.00–0.07)
Basophils Absolute: 0.1 10*3/uL (ref 0.0–0.1)
Basophils Relative: 1 %
Eosinophils Absolute: 0.1 10*3/uL (ref 0.0–0.5)
Eosinophils Relative: 1 %
Immature Granulocytes: 1 %
Lymphocytes Relative: 33 %
Lymphs Abs: 4 10*3/uL (ref 0.7–4.0)
Monocytes Absolute: 1.2 10*3/uL — ABNORMAL HIGH (ref 0.1–1.0)
Monocytes Relative: 10 %
Neutro Abs: 6.7 10*3/uL (ref 1.7–7.7)
Neutrophils Relative %: 54 %

## 2022-02-04 LAB — RAPID URINE DRUG SCREEN, HOSP PERFORMED
Amphetamines: NOT DETECTED
Barbiturates: NOT DETECTED
Benzodiazepines: NOT DETECTED
Cocaine: NOT DETECTED
Opiates: NOT DETECTED
Tetrahydrocannabinol: NOT DETECTED

## 2022-02-04 LAB — RESP PANEL BY RT-PCR (FLU A&B, COVID) ARPGX2
Influenza A by PCR: NEGATIVE
Influenza B by PCR: NEGATIVE
SARS Coronavirus 2 by RT PCR: NEGATIVE

## 2022-02-04 LAB — CBC
HCT: 50.4 % (ref 39.0–52.0)
Hemoglobin: 16.9 g/dL (ref 13.0–17.0)
MCH: 29.3 pg (ref 26.0–34.0)
MCHC: 33.5 g/dL (ref 30.0–36.0)
MCV: 87.5 fL (ref 80.0–100.0)
Platelets: 243 10*3/uL (ref 150–400)
RBC: 5.76 MIL/uL (ref 4.22–5.81)
RDW: 12.7 % (ref 11.5–15.5)
WBC: 12.1 10*3/uL — ABNORMAL HIGH (ref 4.0–10.5)
nRBC: 0 % (ref 0.0–0.2)

## 2022-02-04 LAB — APTT: aPTT: 27 seconds (ref 24–36)

## 2022-02-04 LAB — ETHANOL: Alcohol, Ethyl (B): <10 mg/dL (ref <10)

## 2022-02-04 MED ORDER — IOHEXOL 350 MG/ML SOLN
100.0000 mL | Freq: Once | INTRAVENOUS | Status: AC | PRN
  Administered 2022-02-04: 75 mL via INTRAVENOUS

## 2022-02-04 NOTE — Discharge Instructions (Signed)
Please follow-up with your primary care provider and neurologist to continue the outpatient work-up for possible TIA versus partial seizure. Take an 81mg  aspirin daily.

## 2022-02-04 NOTE — ED Notes (Signed)
Pt verbalizes understanding of discharge instructions. Opportunity for questioning and answers were provided. Pt discharged from ED to home.   ? ?

## 2022-02-19 ENCOUNTER — Other Ambulatory Visit (HOSPITAL_COMMUNITY): Payer: Self-pay

## 2022-03-28 ENCOUNTER — Other Ambulatory Visit (HOSPITAL_COMMUNITY): Payer: Self-pay

## 2022-03-28 MED ORDER — TRAMADOL-ACETAMINOPHEN 37.5-325 MG PO TABS
ORAL_TABLET | ORAL | 1 refills | Status: DC
  Filled 2022-03-28: qty 90, 45d supply, fill #0
  Filled 2022-09-24: qty 90, 45d supply, fill #1

## 2022-03-28 MED ORDER — CELECOXIB 200 MG PO CAPS
ORAL_CAPSULE | ORAL | 1 refills | Status: DC
  Filled 2022-03-28 – 2022-03-30 (×2): qty 180, 90d supply, fill #0
  Filled 2022-06-25: qty 180, 90d supply, fill #1

## 2022-03-30 ENCOUNTER — Other Ambulatory Visit (HOSPITAL_COMMUNITY): Payer: Self-pay

## 2022-04-25 ENCOUNTER — Other Ambulatory Visit (HOSPITAL_COMMUNITY): Payer: Self-pay

## 2022-05-01 ENCOUNTER — Other Ambulatory Visit (HOSPITAL_COMMUNITY): Payer: Self-pay

## 2022-05-02 ENCOUNTER — Other Ambulatory Visit (HOSPITAL_COMMUNITY): Payer: Self-pay

## 2022-05-02 MED ORDER — TESTOSTERONE 20.25 MG/ACT (1.62%) TD GEL
TRANSDERMAL | 2 refills | Status: DC
  Filled 2022-05-02: qty 450, 90d supply, fill #0
  Filled 2022-08-14: qty 450, 90d supply, fill #1

## 2022-05-03 ENCOUNTER — Other Ambulatory Visit (HOSPITAL_COMMUNITY): Payer: Self-pay

## 2022-05-15 ENCOUNTER — Other Ambulatory Visit (HOSPITAL_COMMUNITY): Payer: Self-pay

## 2022-05-15 IMAGING — CT CT HEAD W/O CM
4 series · 17 of 47 positions shown, 19 images · non-contrast
Comparison: MRI 12/30/2006

CLINICAL DATA: Right-sided numbness, intermittent beginning this
afternoon.



[Series 2: head wo · axial · 0.46mm/px · z∈[-227,-97]mm · 7 of 36 slices shown, 9 images]
[im 5/36  brain]
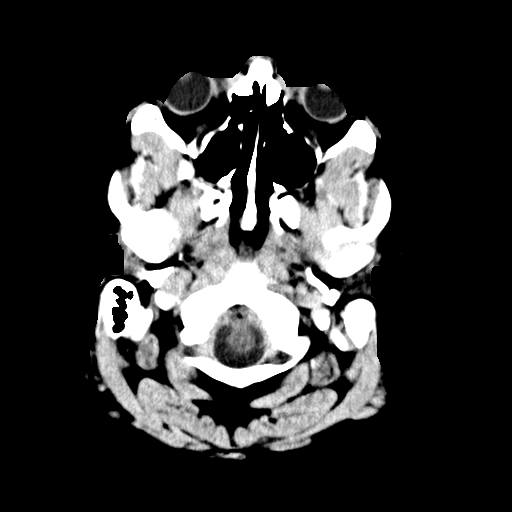
[im 5/36  bone]
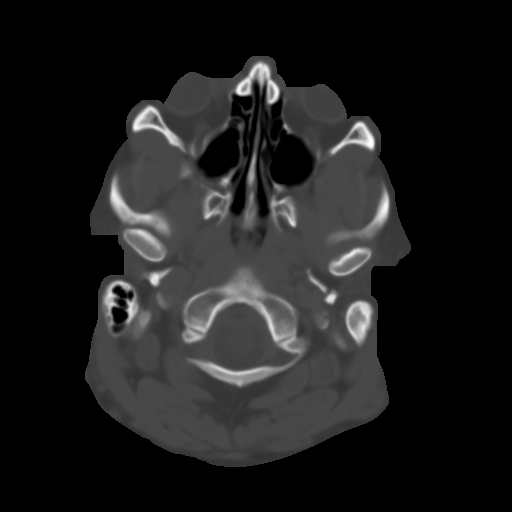
[im 9/36  brain]
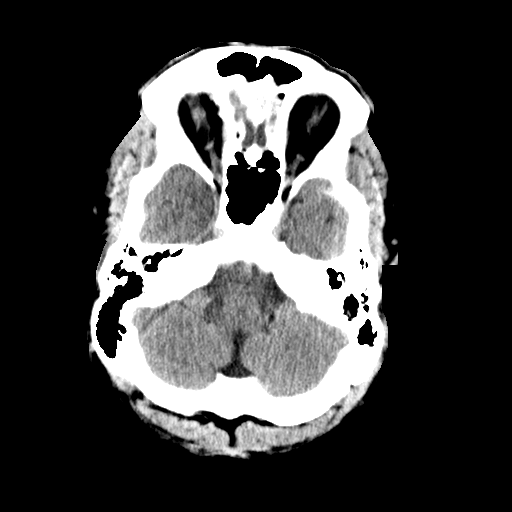
[im 14/36  brain]
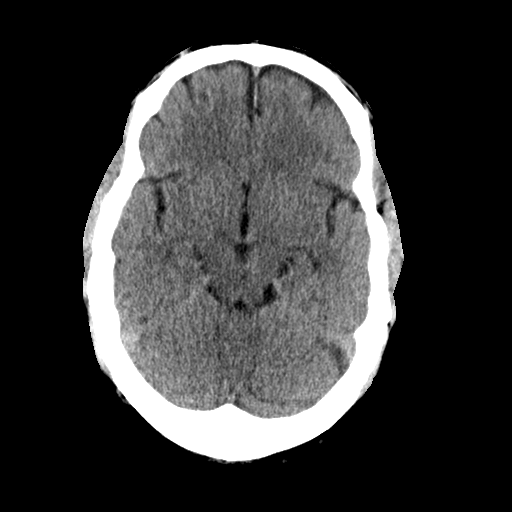
[im 18/36  brain]
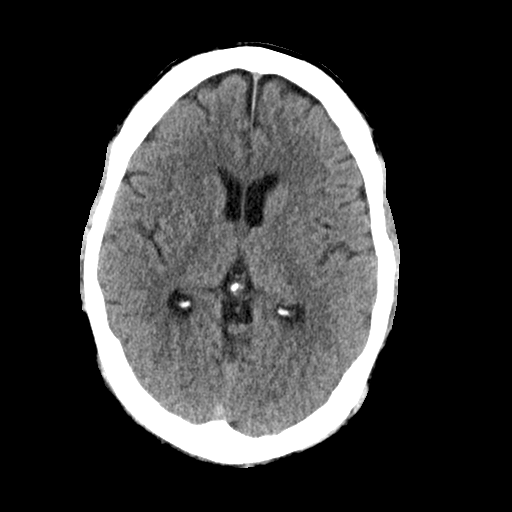
[im 22/36  brain]
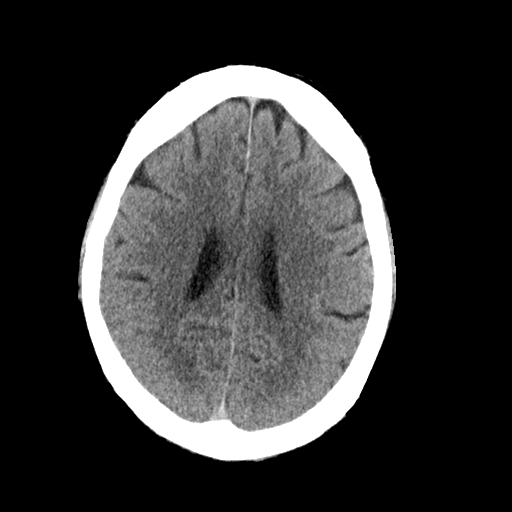
[im 22/36  bone]
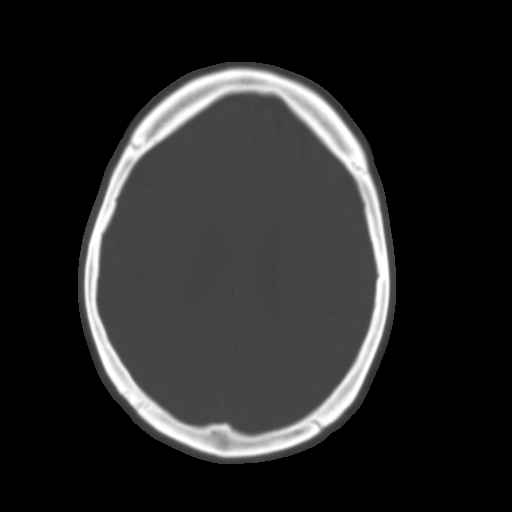
[im 27/36  brain]
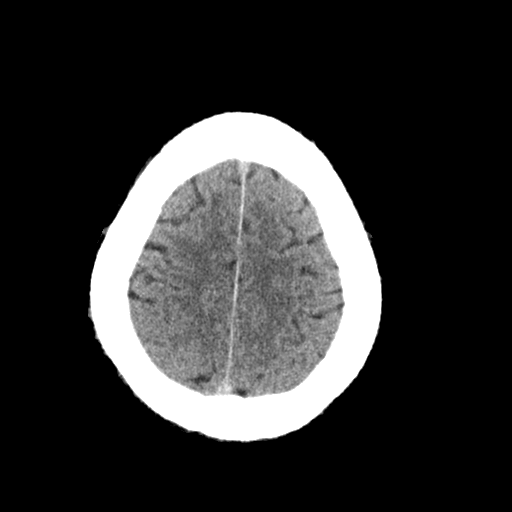
[im 31/36  brain]
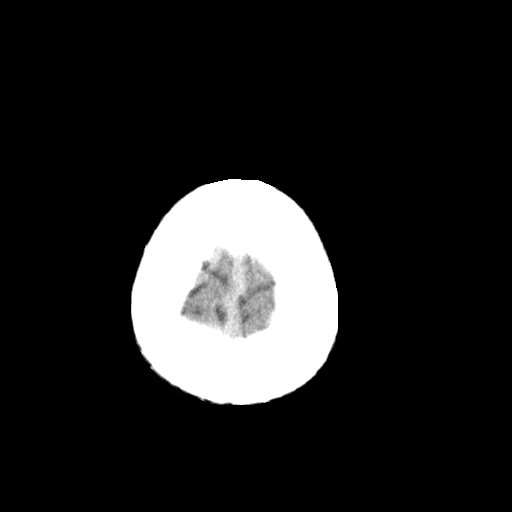

[Series 3: head bone · axial · 0.46mm/px · z∈[-231,-169]mm · 4 of 89 slices shown]
[im 9/89  bone]
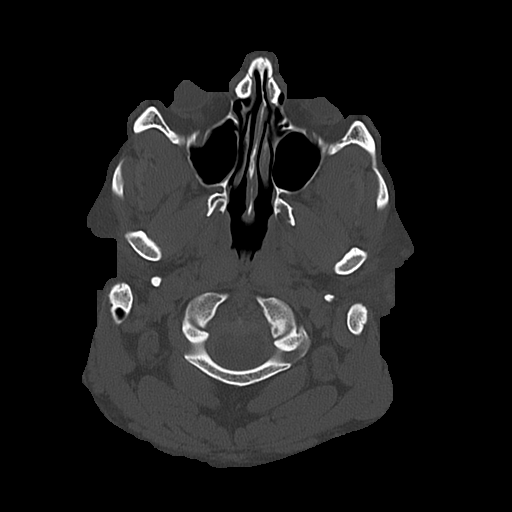
[im 18/89  bone]
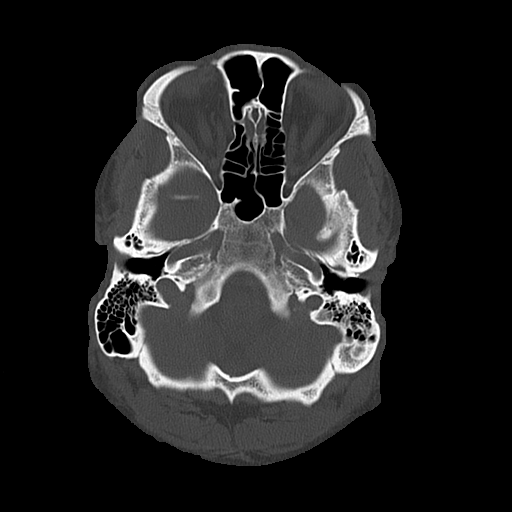
[im 27/89  bone]
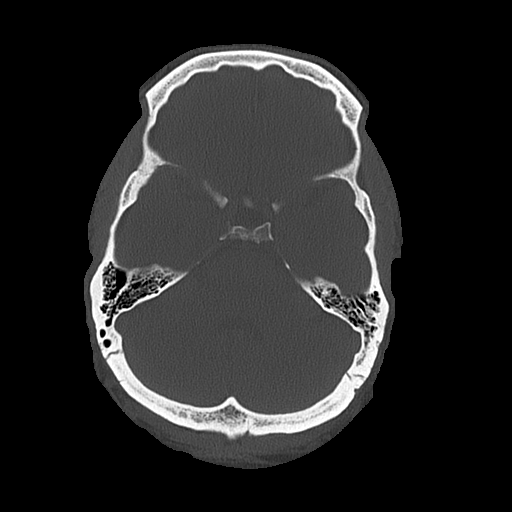
[im 40/89  bone]
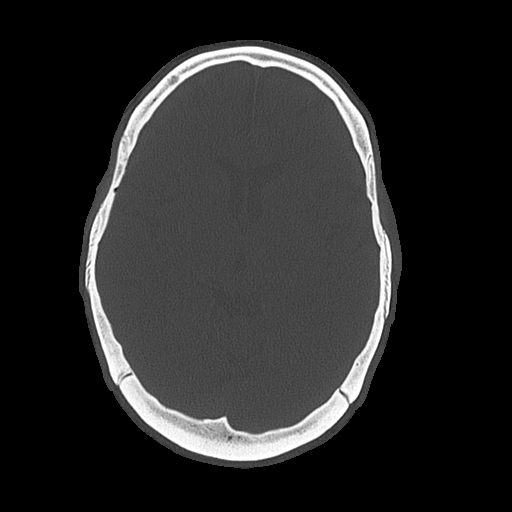

[Series 4: coronal soft · coronal · 0.37mm/px · 3 of 73 slices shown]
[im 25/73  brain]
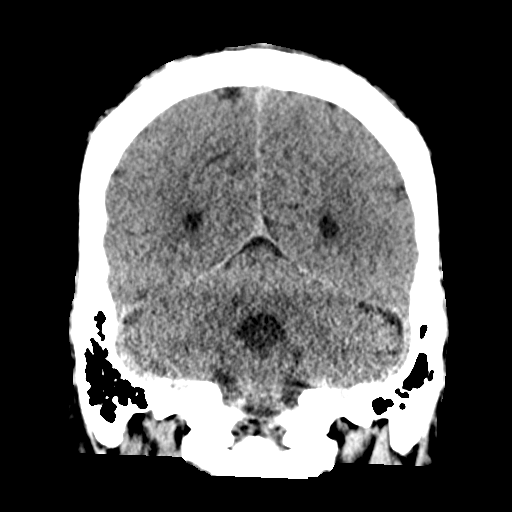
[im 33/73  brain]
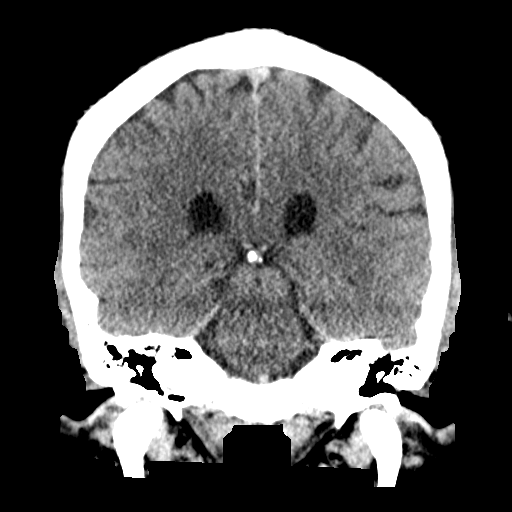
[im 41/73  brain]
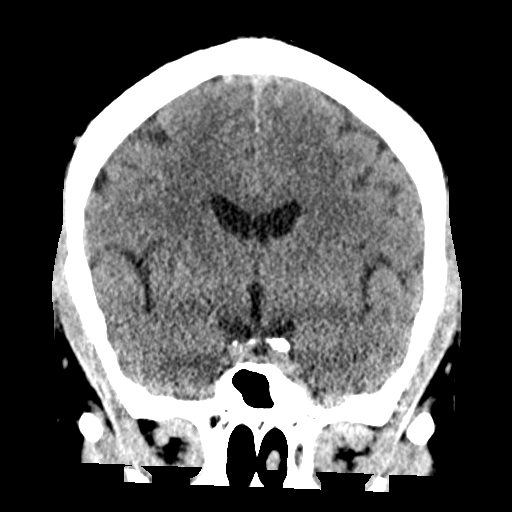

[Series 5: sagittal soft · sagittal · 0.37mm/px · 3 of 63 slices shown]
[im 21/63  brain]
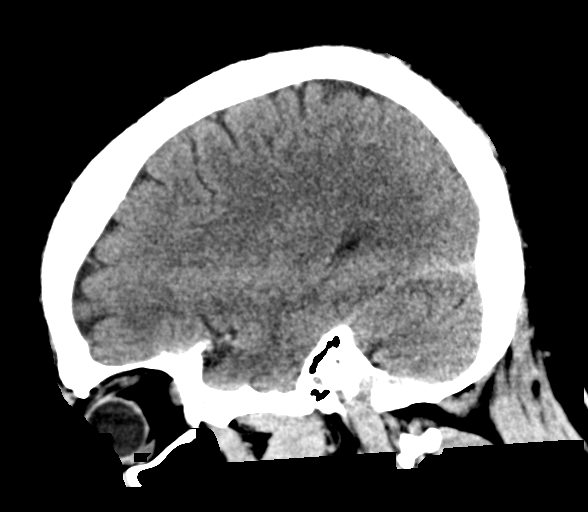
[im 32/63  brain]
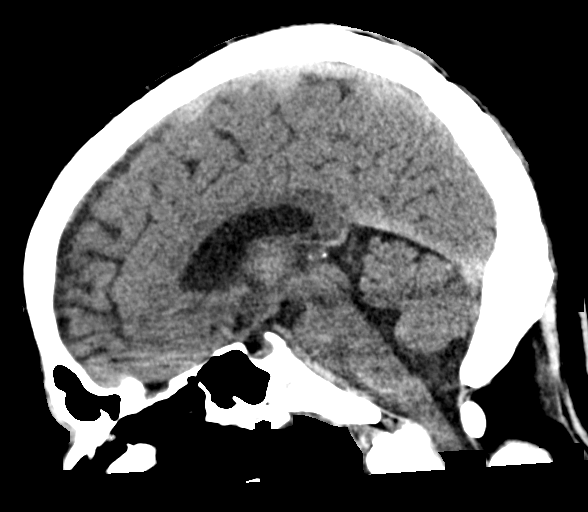
[im 42/63  brain]
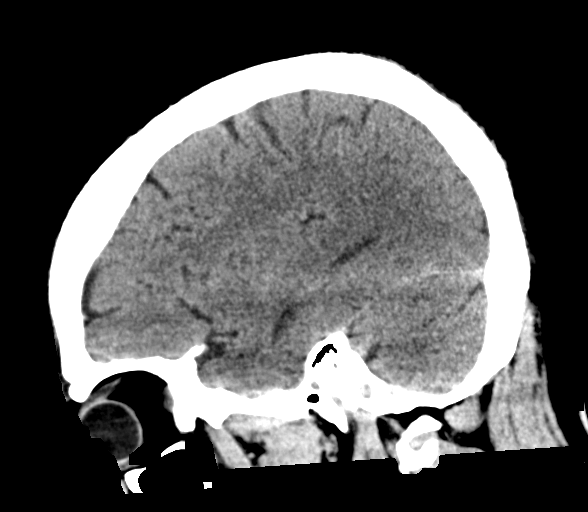

[17 of 47 positions shown; findings below may reference images not displayed]

FINDINGS: Brain: The brain shows a normal appearance without evidence of
malformation, atrophy, old or acute small or large vessel
infarction, mass lesion, hemorrhage, hydrocephalus or extra-axial
collection.

Vascular: No hyperdense vessel. No evidence of atherosclerotic
calcification.

Skull: Normal.  No traumatic finding.  No focal bone lesion.

Sinuses/Orbits: Sinuses are clear. Orbits appear normal except for
an old medial wall blowout fracture on the right. Mastoids are
clear.

Other: None significant
IMPRESSION: Normal head CT.

## 2022-05-15 MED ORDER — VENLAFAXINE HCL ER 150 MG PO CP24
ORAL_CAPSULE | ORAL | 2 refills | Status: DC
  Filled 2022-05-15 (×2): qty 90, 90d supply, fill #0

## 2022-05-15 MED ORDER — TRAMADOL-ACETAMINOPHEN 37.5-325 MG PO TABS
ORAL_TABLET | ORAL | 2 refills | Status: DC
  Filled 2022-05-15: qty 180, 45d supply, fill #0

## 2022-05-16 ENCOUNTER — Other Ambulatory Visit (HOSPITAL_COMMUNITY): Payer: Self-pay

## 2022-05-16 IMAGING — CT CT ANGIO HEAD-NECK (W OR W/O PERF)
3 of 7 series · 10 of 35 positions shown · IV contrast (APPLIED)
Comparison: Head CT from 02/03/2022.

CLINICAL DATA: Initial evaluation for acute headache, right-sided
numbness.

EXAM:
CT ANGIOGRAPHY HEAD AND NECK
TECHNIQUE: Multidetector CT imaging of the head and neck was performed using
the standard protocol during bolus administration of intravenous
contrast. Multiplanar CT image reconstructions and MIPs were
obtained to evaluate the vascular anatomy. Carotid stenosis
measurements (when applicable) are obtained utilizing NASCET
criteria, using the distal internal carotid diameter as the
denominator.

[Series 6: cta head · axial · 0.63mm/px · z∈[-359,-241]mm · 2 of 178 slices shown]
[im 60/178  soft-tissue]
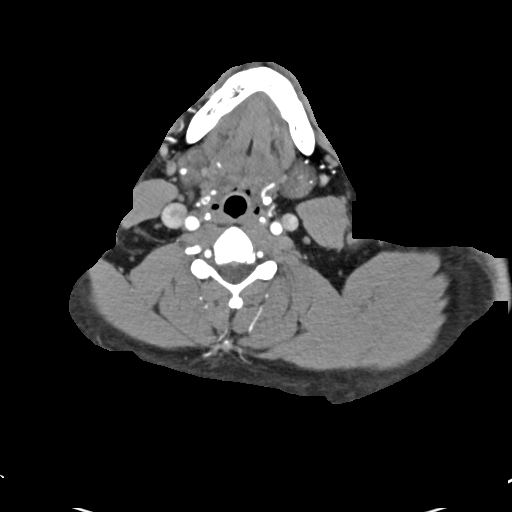
[im 119/178  soft-tissue]
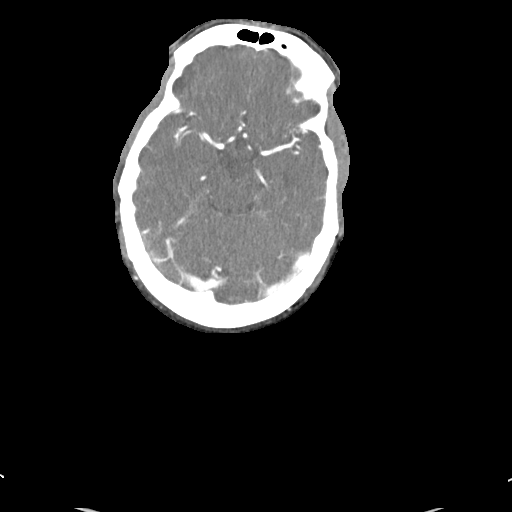

[Series 8: ax thin · axial · 0.49mm/px · z∈[-486,-230]mm · 6 of 372 slices shown]
[im 54/372  soft-tissue]
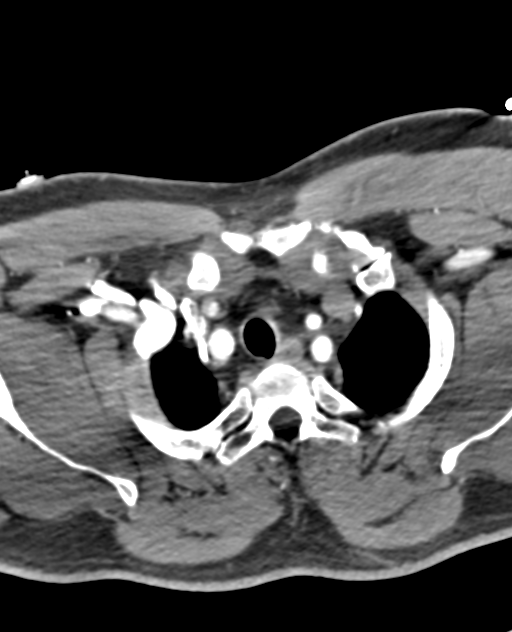
[im 107/372  bone]
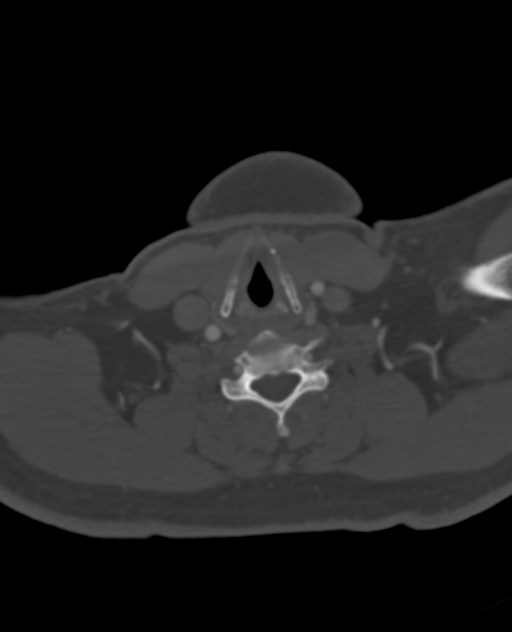
[im 160/372  soft-tissue]
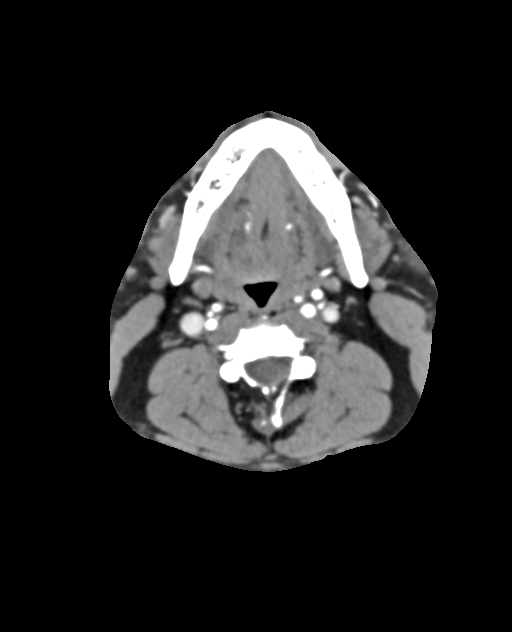
[im 213/372  bone]
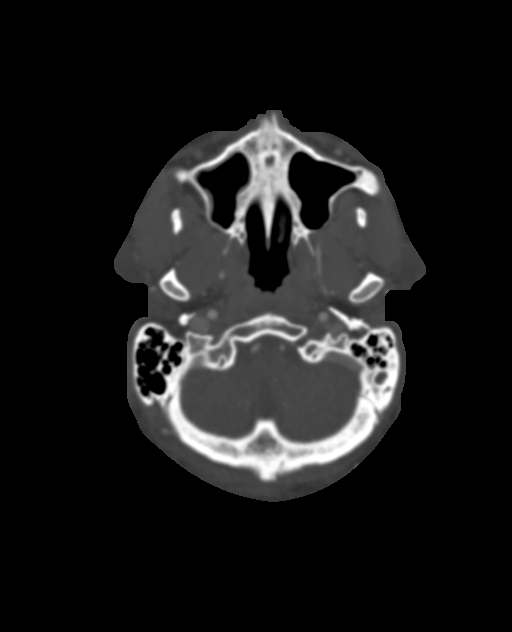
[im 266/372  soft-tissue]
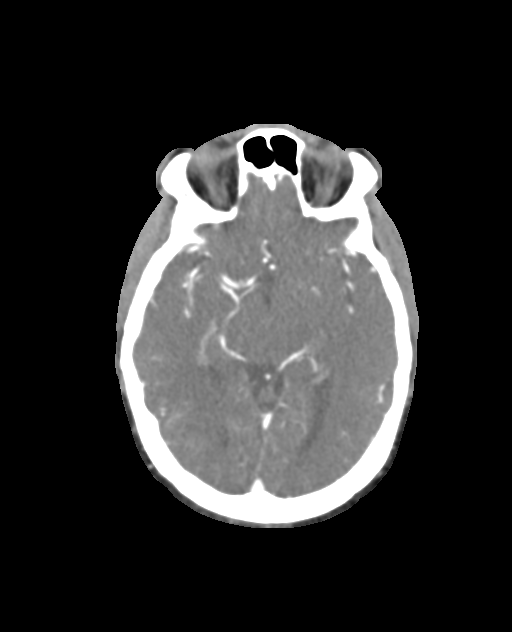
[im 319/372  bone]
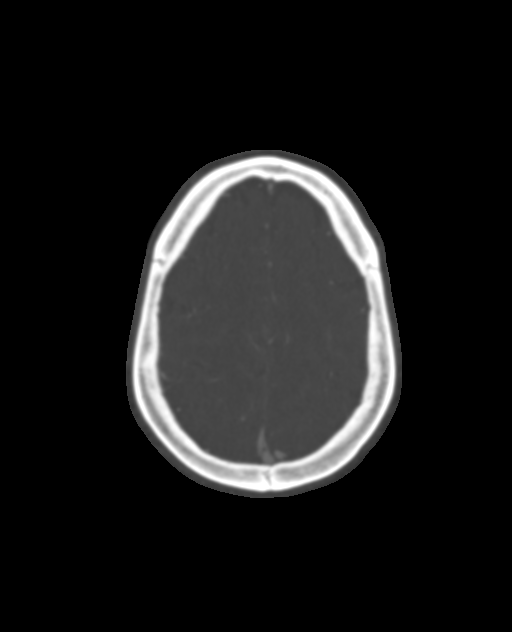

[Series 9: sag thin · sagittal · 0.61mm/px · 2 of 252 slices shown]
[im 51/252  soft-tissue]
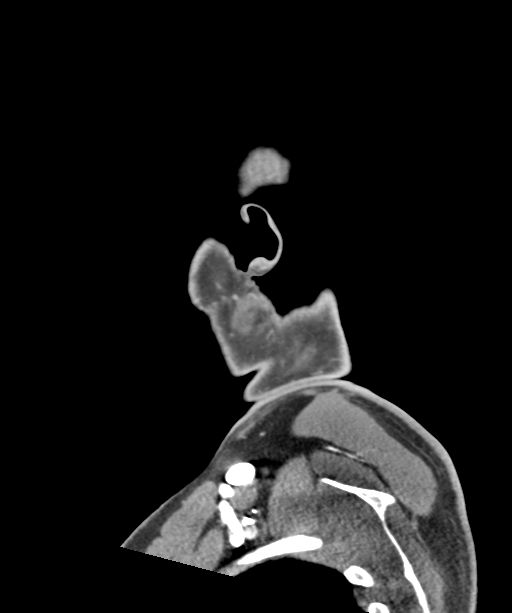
[im 202/252  soft-tissue]
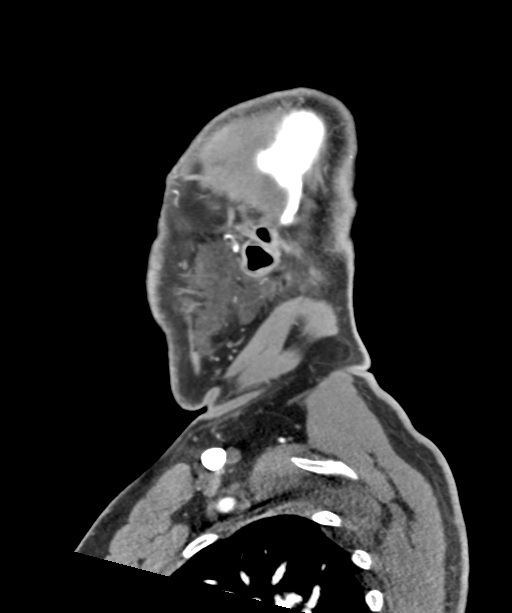

[10 of 35 positions shown; findings below may reference images not displayed]

RADIATION DOSE REDUCTION: This exam was performed according to the
departmental dose-optimization program which includes automated
exposure control, adjustment of the mA and/or kV according to
patient size and/or use of iterative reconstruction technique.

CONTRAST:  75mL OMNIPAQUE IOHEXOL 350 MG/ML SOLN
FINDINGS: CTA NECK FINDINGS

Aortic arch: Visualized aortic arch normal caliber with normal
branch pattern. No stenosis about the origin of the great vessels.

Right carotid system: Right common and internal carotid arteries
widely patent without stenosis, dissection or occlusion.

Left carotid system: Left common and internal carotid arteries
widely patent without stenosis, dissection or occlusion.

Vertebral arteries: Both vertebral arteries arise from the
subclavian arteries. No proximal subclavian artery stenosis. Both
vertebral arteries widely patent without stenosis, dissection or
occlusion.

Skeleton: No discrete or worrisome osseous lesions.

Other neck: No other acute soft tissue abnormality within the neck.

Upper chest: Scattered atelectatic changes noted within the
visualized lungs. Visualized upper chest demonstrates no other acute
finding.

Review of the MIP images confirms the above findings

CTA HEAD FINDINGS

Anterior circulation: Both internal carotid arteries widely patent
to the termini without stenosis. A1 segments widely patent. Normal
anterior communicating artery complex. Both anterior cerebral
arteries widely patent to their distal aspects without stenosis. No
M1 stenosis or occlusion. Normal MCA bifurcations. Distal MCA
branches well perfused and symmetric.

Posterior circulation: Both vertebral arteries patent to the
vertebrobasilar junction without stenosis. Both PICA patent. Basilar
patent to its distal aspect without stenosis. Superior cerebellar
arteries patent bilaterally. Both PCAs primarily supplied via the
basilar. PCAs mildly irregular with associated mild to moderate
bilateral P2 stenoses. PCAs remain patent to their distal aspects.

Venous sinuses: Patent allowing for timing the contrast bolus.

Anatomic variants: None significant.  No aneurysm.

Review of the MIP images confirms the above findings
IMPRESSION: 1. Negative CTA for large vessel occlusion.
2. Mild atheromatous irregularity about the PCAs with associated
mild to moderate bilateral P2 stenoses.
3. Otherwise negative CTA of the head and neck. No other
hemodynamically significant or correctable stenosis. No other acute
vascular abnormality identified.

## 2022-06-25 ENCOUNTER — Other Ambulatory Visit (HOSPITAL_COMMUNITY): Payer: Self-pay

## 2022-07-30 ENCOUNTER — Other Ambulatory Visit (HOSPITAL_COMMUNITY): Payer: Self-pay

## 2022-07-30 MED ORDER — MUPIROCIN 2 % EX OINT
TOPICAL_OINTMENT | CUTANEOUS | 0 refills | Status: DC
  Filled 2022-07-30: qty 22, 30d supply, fill #0

## 2022-08-14 ENCOUNTER — Other Ambulatory Visit (HOSPITAL_COMMUNITY): Payer: Self-pay

## 2022-08-14 MED ORDER — AMITRIPTYLINE HCL 25 MG PO TABS
ORAL_TABLET | ORAL | 3 refills | Status: DC
  Filled 2022-08-14: qty 90, 90d supply, fill #0
  Filled 2022-09-24 – 2022-11-12 (×2): qty 90, 90d supply, fill #1
  Filled 2023-02-12: qty 90, 90d supply, fill #2
  Filled 2023-05-23: qty 90, 90d supply, fill #3

## 2022-08-15 ENCOUNTER — Other Ambulatory Visit (HOSPITAL_COMMUNITY): Payer: Self-pay

## 2022-08-16 ENCOUNTER — Other Ambulatory Visit (HOSPITAL_COMMUNITY): Payer: Self-pay

## 2022-08-17 ENCOUNTER — Other Ambulatory Visit (HOSPITAL_COMMUNITY): Payer: Self-pay

## 2022-09-24 ENCOUNTER — Other Ambulatory Visit (HOSPITAL_COMMUNITY): Payer: Self-pay

## 2022-09-27 ENCOUNTER — Other Ambulatory Visit (HOSPITAL_COMMUNITY): Payer: Self-pay

## 2022-10-01 ENCOUNTER — Other Ambulatory Visit (HOSPITAL_COMMUNITY): Payer: Self-pay

## 2022-10-01 MED ORDER — CELECOXIB 200 MG PO CAPS
ORAL_CAPSULE | ORAL | 1 refills | Status: DC
  Filled 2022-10-01: qty 180, 90d supply, fill #0
  Filled 2022-12-24: qty 180, 90d supply, fill #1

## 2022-10-26 ENCOUNTER — Other Ambulatory Visit (HOSPITAL_COMMUNITY): Payer: Self-pay

## 2022-11-12 ENCOUNTER — Other Ambulatory Visit (HOSPITAL_COMMUNITY): Payer: Self-pay

## 2022-12-03 ENCOUNTER — Other Ambulatory Visit (HOSPITAL_COMMUNITY): Payer: Self-pay

## 2022-12-03 MED ORDER — TRAMADOL-ACETAMINOPHEN 37.5-325 MG PO TABS
1.0000 | ORAL_TABLET | Freq: Two times a day (BID) | ORAL | 2 refills | Status: DC | PRN
  Filled 2022-12-03: qty 180, 45d supply, fill #0
  Filled 2023-03-06: qty 180, 45d supply, fill #1

## 2022-12-03 MED ORDER — TESTOSTERONE 1.62 % TD GEL
4.0000 | Freq: Every day | TRANSDERMAL | 2 refills | Status: DC
Start: 2022-12-03 — End: 2023-07-11
  Filled 2022-12-03: qty 75, 5d supply, fill #0
  Filled 2022-12-11: qty 375, 85d supply, fill #0
  Filled 2023-03-06: qty 450, 90d supply, fill #1
  Filled 2023-03-28: qty 150, 30d supply, fill #1
  Filled 2023-04-24: qty 150, 30d supply, fill #2
  Filled 2023-05-23: qty 150, 30d supply, fill #3
  Filled 2023-05-28: qty 450, 90d supply, fill #3

## 2022-12-04 ENCOUNTER — Other Ambulatory Visit (HOSPITAL_COMMUNITY): Payer: Self-pay

## 2022-12-04 ENCOUNTER — Other Ambulatory Visit: Payer: Self-pay

## 2022-12-05 ENCOUNTER — Other Ambulatory Visit: Payer: Self-pay

## 2022-12-07 ENCOUNTER — Other Ambulatory Visit: Payer: Self-pay

## 2022-12-11 ENCOUNTER — Other Ambulatory Visit: Payer: Self-pay

## 2022-12-11 ENCOUNTER — Other Ambulatory Visit (HOSPITAL_COMMUNITY): Payer: Self-pay

## 2022-12-12 ENCOUNTER — Other Ambulatory Visit: Payer: Self-pay

## 2022-12-12 ENCOUNTER — Other Ambulatory Visit (HOSPITAL_COMMUNITY): Payer: Self-pay

## 2022-12-13 ENCOUNTER — Other Ambulatory Visit: Payer: Self-pay

## 2022-12-18 ENCOUNTER — Other Ambulatory Visit (HOSPITAL_COMMUNITY): Payer: Self-pay

## 2022-12-21 ENCOUNTER — Other Ambulatory Visit (HOSPITAL_COMMUNITY): Payer: Self-pay

## 2022-12-24 ENCOUNTER — Other Ambulatory Visit (HOSPITAL_COMMUNITY): Payer: Self-pay

## 2023-02-12 ENCOUNTER — Other Ambulatory Visit (HOSPITAL_COMMUNITY): Payer: Self-pay

## 2023-02-19 DIAGNOSIS — M65342 Trigger finger, left ring finger: Secondary | ICD-10-CM | POA: Diagnosis not present

## 2023-02-19 DIAGNOSIS — Z6831 Body mass index (BMI) 31.0-31.9, adult: Secondary | ICD-10-CM | POA: Diagnosis not present

## 2023-02-19 DIAGNOSIS — M6530 Trigger finger, unspecified finger: Secondary | ICD-10-CM | POA: Diagnosis not present

## 2023-02-19 DIAGNOSIS — M65332 Trigger finger, left middle finger: Secondary | ICD-10-CM | POA: Diagnosis not present

## 2023-03-06 ENCOUNTER — Other Ambulatory Visit: Payer: Self-pay

## 2023-03-06 ENCOUNTER — Other Ambulatory Visit (HOSPITAL_COMMUNITY): Payer: Self-pay

## 2023-03-07 ENCOUNTER — Other Ambulatory Visit: Payer: Self-pay

## 2023-03-19 ENCOUNTER — Other Ambulatory Visit: Payer: Self-pay

## 2023-03-19 ENCOUNTER — Other Ambulatory Visit (HOSPITAL_COMMUNITY): Payer: Self-pay

## 2023-03-21 ENCOUNTER — Other Ambulatory Visit: Payer: Self-pay

## 2023-03-25 ENCOUNTER — Other Ambulatory Visit (HOSPITAL_COMMUNITY): Payer: Self-pay

## 2023-03-27 ENCOUNTER — Other Ambulatory Visit: Payer: Self-pay

## 2023-03-28 ENCOUNTER — Other Ambulatory Visit (HOSPITAL_COMMUNITY): Payer: Self-pay

## 2023-04-01 ENCOUNTER — Other Ambulatory Visit (HOSPITAL_COMMUNITY): Payer: Self-pay

## 2023-04-01 MED ORDER — CELECOXIB 200 MG PO CAPS
200.0000 mg | ORAL_CAPSULE | Freq: Two times a day (BID) | ORAL | 1 refills | Status: DC
  Filled 2023-04-01: qty 180, 90d supply, fill #0
  Filled 2023-07-01: qty 180, 90d supply, fill #1

## 2023-04-02 ENCOUNTER — Other Ambulatory Visit (HOSPITAL_COMMUNITY): Payer: Self-pay

## 2023-04-15 ENCOUNTER — Other Ambulatory Visit (HOSPITAL_COMMUNITY): Payer: Self-pay

## 2023-04-23 ENCOUNTER — Other Ambulatory Visit (HOSPITAL_COMMUNITY): Payer: Self-pay

## 2023-04-23 MED ORDER — FLUOXETINE HCL 20 MG PO CAPS
20.0000 mg | ORAL_CAPSULE | Freq: Every day | ORAL | 3 refills | Status: DC
  Filled 2023-04-23 (×2): qty 90, 90d supply, fill #0
  Filled 2023-07-22 (×2): qty 90, 90d supply, fill #1
  Filled 2023-10-21: qty 90, 90d supply, fill #2
  Filled 2024-01-06: qty 90, 90d supply, fill #0

## 2023-04-24 ENCOUNTER — Other Ambulatory Visit: Payer: Self-pay

## 2023-04-24 ENCOUNTER — Other Ambulatory Visit (HOSPITAL_COMMUNITY): Payer: Self-pay

## 2023-05-23 ENCOUNTER — Other Ambulatory Visit (HOSPITAL_COMMUNITY): Payer: Self-pay

## 2023-05-24 ENCOUNTER — Other Ambulatory Visit: Payer: Self-pay

## 2023-05-25 ENCOUNTER — Other Ambulatory Visit (HOSPITAL_COMMUNITY): Payer: Self-pay

## 2023-05-27 ENCOUNTER — Other Ambulatory Visit (HOSPITAL_COMMUNITY): Payer: Self-pay

## 2023-05-28 ENCOUNTER — Other Ambulatory Visit: Payer: Self-pay

## 2023-05-28 ENCOUNTER — Other Ambulatory Visit (HOSPITAL_COMMUNITY): Payer: Self-pay

## 2023-05-29 ENCOUNTER — Other Ambulatory Visit: Payer: Self-pay

## 2023-05-31 ENCOUNTER — Other Ambulatory Visit (HOSPITAL_COMMUNITY): Payer: Self-pay

## 2023-06-03 ENCOUNTER — Other Ambulatory Visit (HOSPITAL_COMMUNITY): Payer: Self-pay

## 2023-06-03 ENCOUNTER — Other Ambulatory Visit: Payer: Self-pay

## 2023-06-17 DIAGNOSIS — S46912A Strain of unspecified muscle, fascia and tendon at shoulder and upper arm level, left arm, initial encounter: Secondary | ICD-10-CM | POA: Diagnosis not present

## 2023-07-01 ENCOUNTER — Other Ambulatory Visit (HOSPITAL_COMMUNITY): Payer: Self-pay

## 2023-07-10 ENCOUNTER — Other Ambulatory Visit: Payer: Self-pay

## 2023-07-10 ENCOUNTER — Encounter (HOSPITAL_COMMUNITY): Payer: Self-pay

## 2023-07-10 ENCOUNTER — Observation Stay (HOSPITAL_COMMUNITY): Payer: 59

## 2023-07-10 ENCOUNTER — Observation Stay (HOSPITAL_COMMUNITY)
Admission: EM | Admit: 2023-07-10 | Discharge: 2023-07-11 | Disposition: A | Discharge status: Home / Self Care | Payer: 59 | Attending: Internal Medicine | Admitting: Internal Medicine

## 2023-07-10 ENCOUNTER — Emergency Department (HOSPITAL_COMMUNITY): Payer: 59

## 2023-07-10 DIAGNOSIS — R569 Unspecified convulsions: Secondary | ICD-10-CM | POA: Diagnosis not present

## 2023-07-10 DIAGNOSIS — Z8639 Personal history of other endocrine, nutritional and metabolic disease: Secondary | ICD-10-CM

## 2023-07-10 DIAGNOSIS — R29818 Other symptoms and signs involving the nervous system: Secondary | ICD-10-CM

## 2023-07-10 DIAGNOSIS — I6621 Occlusion and stenosis of right posterior cerebral artery: Secondary | ICD-10-CM | POA: Insufficient documentation

## 2023-07-10 DIAGNOSIS — R42 Dizziness and giddiness: Secondary | ICD-10-CM | POA: Diagnosis not present

## 2023-07-10 DIAGNOSIS — N179 Acute kidney failure, unspecified: Secondary | ICD-10-CM | POA: Insufficient documentation

## 2023-07-10 DIAGNOSIS — R200 Anesthesia of skin: Secondary | ICD-10-CM | POA: Diagnosis not present

## 2023-07-10 DIAGNOSIS — Z79899 Other long term (current) drug therapy: Secondary | ICD-10-CM | POA: Diagnosis not present

## 2023-07-10 DIAGNOSIS — R2990 Unspecified symptoms and signs involving the nervous system: Principal | ICD-10-CM

## 2023-07-10 DIAGNOSIS — Z7982 Long term (current) use of aspirin: Secondary | ICD-10-CM | POA: Insufficient documentation

## 2023-07-10 DIAGNOSIS — R531 Weakness: Secondary | ICD-10-CM | POA: Diagnosis not present

## 2023-07-10 DIAGNOSIS — G459 Transient cerebral ischemic attack, unspecified: Secondary | ICD-10-CM | POA: Diagnosis not present

## 2023-07-10 LAB — COMPREHENSIVE METABOLIC PANEL WITH GFR
ALT: 42 U/L (ref 0–44)
AST: 25 U/L (ref 15–41)
Albumin: 4.2 g/dL (ref 3.5–5.0)
Alkaline Phosphatase: 63 U/L (ref 38–126)
Anion gap: 4 — ABNORMAL LOW (ref 5–15)
BUN: 29 mg/dL — ABNORMAL HIGH (ref 6–20)
CO2: 27 mmol/L (ref 22–32)
Calcium: 8.7 mg/dL — ABNORMAL LOW (ref 8.9–10.3)
Chloride: 106 mmol/L (ref 98–111)
Creatinine, Ser: 1.47 mg/dL — ABNORMAL HIGH (ref 0.61–1.24)
GFR, Estimated: 56 mL/min — ABNORMAL LOW
Glucose, Bld: 90 mg/dL (ref 70–99)
Potassium: 4 mmol/L (ref 3.5–5.1)
Sodium: 137 mmol/L (ref 135–145)
Total Bilirubin: 1 mg/dL (ref 0.3–1.2)
Total Protein: 6.7 g/dL (ref 6.5–8.1)

## 2023-07-10 LAB — I-STAT CHEM 8, ED
BUN: 31 mg/dL — ABNORMAL HIGH (ref 6–20)
Calcium, Ion: 1.15 mmol/L (ref 1.15–1.40)
Chloride: 101 mmol/L (ref 98–111)
Creatinine, Ser: 1.6 mg/dL — ABNORMAL HIGH (ref 0.61–1.24)
Glucose, Bld: 87 mg/dL (ref 70–99)
HCT: 54 % — ABNORMAL HIGH (ref 39.0–52.0)
Hemoglobin: 18.4 g/dL — ABNORMAL HIGH (ref 13.0–17.0)
Potassium: 4 mmol/L (ref 3.5–5.1)
Sodium: 140 mmol/L (ref 135–145)
TCO2: 29 mmol/L (ref 22–32)

## 2023-07-10 LAB — LIPID PANEL
Cholesterol: 232 mg/dL — ABNORMAL HIGH (ref 0–200)
HDL: 44 mg/dL (ref >40)
LDL Cholesterol: 162 mg/dL — ABNORMAL HIGH (ref 0–99)
Total CHOL/HDL Ratio: 5.3 RATIO
Triglycerides: 129 mg/dL (ref <150)
VLDL: 26 mg/dL (ref 0–40)

## 2023-07-10 LAB — DIFFERENTIAL
Abs Immature Granulocytes: 0.03 10*3/uL (ref 0.00–0.07)
Basophils Absolute: 0.1 10*3/uL (ref 0.0–0.1)
Basophils Relative: 1 %
Eosinophils Absolute: 0.1 10*3/uL (ref 0.0–0.5)
Eosinophils Relative: 1 %
Immature Granulocytes: 0 %
Lymphocytes Relative: 27 %
Lymphs Abs: 2 10*3/uL (ref 0.7–4.0)
Monocytes Absolute: 0.6 10*3/uL (ref 0.1–1.0)
Monocytes Relative: 9 %
Neutro Abs: 4.6 10*3/uL (ref 1.7–7.7)
Neutrophils Relative %: 62 %

## 2023-07-10 LAB — CBC
HCT: 55.3 % — ABNORMAL HIGH (ref 39.0–52.0)
Hemoglobin: 18.3 g/dL — ABNORMAL HIGH (ref 13.0–17.0)
MCH: 29.8 pg (ref 26.0–34.0)
MCHC: 33.1 g/dL (ref 30.0–36.0)
MCV: 90.1 fL (ref 80.0–100.0)
Platelets: 218 10*3/uL (ref 150–400)
RBC: 6.14 MIL/uL — ABNORMAL HIGH (ref 4.22–5.81)
RDW: 12.8 % (ref 11.5–15.5)
WBC: 7.3 10*3/uL (ref 4.0–10.5)
nRBC: 0 % (ref 0.0–0.2)

## 2023-07-10 LAB — HEMOGLOBIN A1C
Hgb A1c MFr Bld: 5.2 % (ref 4.8–5.6)
Mean Plasma Glucose: 102.54 mg/dL

## 2023-07-10 LAB — PROTIME-INR
INR: 1 (ref 0.8–1.2)
Prothrombin Time: 13.3 seconds (ref 11.4–15.2)

## 2023-07-10 LAB — TSH: TSH: 1.509 u[IU]/mL (ref 0.350–4.500)

## 2023-07-10 LAB — CBG MONITORING, ED: Glucose-Capillary: 99 mg/dL (ref 70–99)

## 2023-07-10 LAB — ETHANOL: Alcohol, Ethyl (B): <10 mg/dL

## 2023-07-10 LAB — HIV ANTIBODY (ROUTINE TESTING W REFLEX): HIV Screen 4th Generation wRfx: NONREACTIVE

## 2023-07-10 LAB — APTT: aPTT: 30 s (ref 24–36)

## 2023-07-10 MED ORDER — AMITRIPTYLINE HCL 25 MG PO TABS
25.0000 mg | ORAL_TABLET | Freq: Every day | ORAL | Status: DC
  Administered 2023-07-10: 25 mg via ORAL
  Filled 2023-07-10: qty 1

## 2023-07-10 MED ORDER — MAGNESIUM OXIDE -MG SUPPLEMENT 400 (240 MG) MG PO TABS
400.0000 mg | ORAL_TABLET | Freq: Every day | ORAL | Status: DC
  Administered 2023-07-10 – 2023-07-11 (×2): 400 mg via ORAL
  Filled 2023-07-10 (×2): qty 1

## 2023-07-10 MED ORDER — LACTATED RINGERS IV BOLUS
1000.0000 mL | Freq: Once | INTRAVENOUS | Status: AC
  Administered 2023-07-10: 1000 mL via INTRAVENOUS

## 2023-07-10 MED ORDER — ACETAMINOPHEN 325 MG PO TABS: 650.0000 mg | ORAL_TABLET | Freq: Four times a day (QID) | ORAL | Status: DC | PRN

## 2023-07-10 MED ORDER — FLUOXETINE HCL 20 MG PO CAPS
20.0000 mg | ORAL_CAPSULE | Freq: Every day | ORAL | Status: DC
  Administered 2023-07-10: 20 mg via ORAL
  Filled 2023-07-10: qty 1

## 2023-07-10 MED ORDER — IOHEXOL 350 MG/ML SOLN
60.0000 mL | Freq: Once | INTRAVENOUS | Status: AC | PRN
  Administered 2023-07-10: 60 mL via INTRAVENOUS

## 2023-07-10 MED ORDER — TRAMADOL HCL 50 MG PO TABS
50.0000 mg | ORAL_TABLET | Freq: Two times a day (BID) | ORAL | Status: DC | PRN
  Administered 2023-07-10: 50 mg via ORAL
  Filled 2023-07-10: qty 1

## 2023-07-10 MED ORDER — ACETAMINOPHEN 650 MG RE SUPP: 650.0000 mg | Freq: Four times a day (QID) | RECTAL | Status: DC | PRN

## 2023-07-10 MED ORDER — ATORVASTATIN CALCIUM 80 MG PO TABS
80.0000 mg | ORAL_TABLET | Freq: Every day | ORAL | Status: DC
  Administered 2023-07-10 – 2023-07-11 (×2): 80 mg via ORAL
  Filled 2023-07-10: qty 2
  Filled 2023-07-10: qty 1

## 2023-07-10 MED ORDER — LACTATED RINGERS IV SOLN: INTRAVENOUS | Status: DC

## 2023-07-10 MED ORDER — SENNOSIDES-DOCUSATE SODIUM 8.6-50 MG PO TABS: 1.0000 | ORAL_TABLET | Freq: Every evening | ORAL | Status: DC | PRN

## 2023-07-10 MED ORDER — ASPIRIN 81 MG PO CHEW
81.0000 mg | CHEWABLE_TABLET | Freq: Every day | ORAL | Status: DC
  Administered 2023-07-10 – 2023-07-11 (×2): 81 mg via ORAL
  Filled 2023-07-10 (×2): qty 1

## 2023-07-10 MED ORDER — CELECOXIB 200 MG PO CAPS
200.0000 mg | ORAL_CAPSULE | Freq: Two times a day (BID) | ORAL | Status: DC
  Administered 2023-07-10 – 2023-07-11 (×2): 200 mg via ORAL
  Filled 2023-07-10 (×2): qty 1

## 2023-07-10 MED ORDER — ENOXAPARIN SODIUM 40 MG/0.4ML IJ SOSY
40.0000 mg | PREFILLED_SYRINGE | INTRAMUSCULAR | Status: DC
  Administered 2023-07-10: 40 mg via SUBCUTANEOUS
  Filled 2023-07-10: qty 0.4

## 2023-07-10 NOTE — ED Provider Notes (Signed)
[place] EMERGENCY DEPARTMENT AT [facility] Provider Note   CSN: [id number] Arrival date & time: 07/10/23  4098  An emergency department physician performed an initial assessment on this suspected stroke patient at 0906.  History  Chief Complaint  Patient presents with   Numbness    Matthew Mooney. is a 57 y.o. male.  History of depression anxiety kidney stones currently on testosterone presents to the emergency department with strokelike symptoms.  Last known normal 7:30 AM.  Reported waking up in his usual state of health, went to breakfast this morning and developed left upper and lower extremity numbness.  Also having some dizziness and headache.  He has had dizziness/headache intermittently for the past several weeks.  Wife notes that he seemingly had an abnormal gait as well.  Patient reports symptoms are improving, but still has some subjective paresthesia in his left upper extremity.  He was unable to feel his hand and was not able to get money out of his pocket.  Reports that he had prior rotator cuff surgery with nerve block and states that the numbness felt similar.      Home Medications Prior to Admission medications   Medication Sig Start Date End Date Taking? Authorizing Provider  amitriptyline (ELAVIL) 25 MG tablet TAKE 1 TABLET BY MOUTH AT BEDTIME AS NEEDED FOR SLEEP *NEEDS OFFICE VISIT FOR MORE REFILLS 08/02/20 08/15/21  Lise Auer, MD  amitriptyline (ELAVIL) 25 MG tablet Take 1 (one) Tablet at bedtime for SLEEP 08/14/22     aspirin EC 81 MG tablet Take 81 mg by mouth daily. Swallow whole.    [provider]  celecoxib (CELEBREX) 100 MG capsule Take 100 mg by mouth 2 (two) times daily.    [provider]  celecoxib (CELEBREX) 200 MG capsule Take 1 capsule (200 mg total) by mouth 2 (two) times daily for arthritis 09/18/21     celecoxib (CELEBREX) 200 MG capsule Take 1 capsule by mouth 2 times a day for arthritis 03/28/22     celecoxib  (CELEBREX) 200 MG capsule Take 1 capsule by mouth twice daily for arthritis. 10/01/22     celecoxib (CELEBREX) 200 MG capsule Take 1 capsule (200 mg total) by mouth 2 (two) times daily for ARTHRITIS 04/01/23     cholecalciferol (VITAMIN D3) 25 MCG (1000 UNIT) tablet Take 1,000 Units by mouth daily.    [provider]  fluorouracil (EFUDEX) 5 % cream Apply 1 application on the skin twice a day for 2 weeks. 07/10/21     FLUoxetine (PROZAC) 20 MG capsule Take 1 capsule (20 mg total) by mouth daily.  Need office visit 08/14/21     FLUoxetine (PROZAC) 20 MG capsule TAKE 1 CAPSULE BY MOUTH DAILY * NEEDS OFFICE VISIT FOR MORE REFILLS 08/02/20 08/15/21  Lise Auer, MD  FLUoxetine (PROZAC) 20 MG capsule Take 1 capsule (20 mg total) by mouth daily. 04/22/23     FLUoxetine (PROZAC) 40 MG capsule Take 40 mg by mouth daily.    [provider]  MAGNESIUM PO Take by mouth.    [provider]  Multiple Vitamin (MULTIVITAMIN ADULT PO) Take by mouth.    [provider]  mupirocin ointment (BACTROBAN) 2 % Apply topically to  the skin DAILY To surgical site as directed 07/30/22     Omega-3 Fatty Acids (FISH OIL OMEGA-3 PO) Take 1 capsule by mouth.    [provider]  testosterone (ANDROGEL) 50 MG/5GM (1%) GEL Place 5 g  onto the skin daily.    [provider]  Testosterone 1.62 % GEL APPLY 4 PUMPS ONTO SKIN ONCE A DAY 02/10/21 08/09/21  Buckner Malta, MD  Testosterone 1.62 % GEL Apply 4 Pump topically daily. 12/03/22     Testosterone 20.25 MG/ACT (1.62%) GEL Apply 4 Pump on skin daily 05/17/21     Testosterone 20.25 MG/ACT (1.62%) GEL Apply 4 pumps on skin daily 09/18/21     Testosterone 20.25 MG/ACT (1.62%) GEL Apply 4 pumps topically onto the skin daily. 05/02/22     traMADol (ULTRAM) 50 MG tablet Take 50 mg by mouth every 6 (six) hours as needed.    [provider]  traMADol-acetaminophen (ULTRACET) 37.5-325 MG tablet Take 1-2 tablets by mouth at bedtime as  needed for pain 09/18/21     traMADol-acetaminophen (ULTRACET) 37.5-325 MG tablet Take 1 to 2 tablets by mouth at bedtime, as needed for PAIN 03/28/22     traMADol-acetaminophen (ULTRACET) 37.5-325 MG tablet Take 1 to 2 tablets by mouth 2 times daily as needed for joint pain 05/15/22     traMADol-acetaminophen (ULTRACET) 37.5-325 MG tablet Take 1-2 tablets by mouth 2 (two) times daily as needed for joint pain. 12/03/22     venlafaxine XR (EFFEXOR-XR) 150 MG 24 hr capsule Take 1 capsule by mouth once every morning 05/15/22         Allergies    Adhesive [tape]    Review of Systems   Review of Systems  Physical Exam Updated Vital Signs BP (!) 152/91 (BP Location: Right Arm)   Pulse 88   Resp 20   Wt 87.8 kg   SpO2 98%   BMI 31.24 kg/m  Physical Exam Vitals and nursing note reviewed.  Constitutional:      General: He is not in acute distress.    Appearance: He is not toxic-appearing.  HENT:     Head: Normocephalic.  Cardiovascular:     Rate and Rhythm: Normal rate.  Pulmonary:     Effort: Pulmonary effort is normal.  Abdominal:     General: Abdomen is flat.  Musculoskeletal:        General: Normal range of motion.  Skin:    General: Skin is warm.  Neurological:     Mental Status: He is alert and oriented to person, place, and time.     Comments: Cranial nerves intact.  EOMs intact.  Patient has subjective paresthesias to his left upper extremity as compared to the right.  No motor deficits.  Coordinated movements in upper and lower extremities.  Psychiatric:        Mood and Affect: Mood normal.        Behavior: Behavior normal.     ED Results / Procedures / Treatments   Labs (all labs ordered are listed, but only abnormal results are displayed) Labs Reviewed  CBC - Abnormal; Notable for the following components:      Result Value   RBC 6.14 (*)    Hemoglobin 18.3 (*)    HCT 55.3 (*)    All other components within normal limits  COMPREHENSIVE METABOLIC PANEL -  Abnormal; Notable for the following components:   BUN 29 (*)    Creatinine, Ser 1.47 (*)    Calcium 8.7 (*)    GFR, Estimated 56 (*)    Anion gap 4 (*)    All other components within normal limits  I-STAT CHEM 8, ED - Abnormal; Notable for the following components:   BUN 31 (*)  Creatinine, Ser 1.60 (*)    Hemoglobin 18.4 (*)    HCT 54.0 (*)    All other components within normal limits  PROTIME-INR  APTT  DIFFERENTIAL  ETHANOL  CBG MONITORING, ED    EKG EKG Interpretation Date/Time:  Wednesday July 10 2023 08:58:46 EDT Ventricular Rate:  90 PR Interval:  140 QRS Duration:  88 QT Interval:  324 QTC Calculation: 396 R Axis:   53  Text Interpretation: Normal sinus rhythm ST & T wave abnormality, consider inferior ischemia Abnormal ECG When compared with ECG of 03-Feb-2022 22:26, PREVIOUS ECG IS PRESENT No significant change since Confirmed by Estanislado Pandy [phone]) on 07/10/2023 9:30:10 AM  Radiology MR BRAIN WO CONTRAST  Result Date: 07/10/2023 CLINICAL DATA:  Neuro deficit, acute, stroke suspected. Weakness and numbness of the left side. EXAM: MRI HEAD WITHOUT CONTRAST TECHNIQUE: Multiplanar, multiecho pulse sequences of the brain and surrounding structures were obtained without intravenous contrast. COMPARISON:  Head CT same day.  MRI 12/30/2006. FINDINGS: Brain: Diffusion imaging does not show any acute or subacute infarction. No abnormality affects the brainstem or cerebellum. Cerebral hemispheres show a few punctate foci of T2 and FLAIR signal in the white matter, not likely significant. No cortical or large vessel territory insult. No evidence of thalamic stroke. No mass, hemorrhage, hydrocephalus or extra-axial collection. Vascular: Major vessels at the base of the brain show flow. Skull and upper cervical spine: Negative Sinuses/Orbits: Clear/normal Other: None IMPRESSION: No acute or reversible finding. No cause of the presenting symptoms is identified. Few punctate foci of  T2 and FLAIR signal in the cerebral hemispheric white matter, not likely significant. Electronically Signed   By: Paulina Fusi M.D.   On: 07/10/2023 09:50   CT HEAD CODE STROKE WO CONTRAST  Result Date: 07/10/2023 CLINICAL DATA:  Code stroke. Neuro deficit, acute, stroke suspected. Left-sided weakness. Dizziness. EXAM: CT HEAD WITHOUT CONTRAST TECHNIQUE: Contiguous axial images were obtained from the base of the skull through the vertex without intravenous contrast. RADIATION DOSE REDUCTION: This exam was performed according to the departmental dose-optimization program which includes automated exposure control, adjustment of the mA and/or kV according to patient size and/or use of iterative reconstruction technique. COMPARISON:  CT HEAD WITHOUT CONTRAST 02/03/2022 FINDINGS: Brain: No acute infarct, hemorrhage, or mass lesion is present. No significant white matter lesions are present. The ventricles are of normal size. No significant extraaxial fluid collection is present. Deep brain nuclei are within normal limits. The brainstem and cerebellum are within normal limits. Midline structures are within normal limits. Vascular: No hyperdense vessel or unexpected calcification. Skull: Calvarium is intact. No focal lytic or blastic lesions are present. No significant extracranial soft tissue lesion is present. Sinuses/Orbits: The remote right orbital blowout fracture is noted. The globes and orbits are otherwise within normal limits. The paranasal sinuses and mastoid air cells are clear. Other: ASPECTS (Alberta Stroke Program Early CT Score) - Ganglionic level infarction (caudate, lentiform nuclei, internal capsule, insula, M1-M3 cortex): 7/7 - Supraganglionic infarction (M4-M6 cortex): 3/3 Total score (0-10 with 10 being normal): 10/10 IMPRESSION: 1. Negative CT of the head. 2. Aspects is 10/10. 3. Remote right orbital blowout fracture. The above was relayed via text pager to Dr. Wilford Corner on 07/10/2023 at 09:26 .  Electronically Signed   By: Marin Roberts M.D.   On: 07/10/2023 09:27    Procedures Procedures    Medications Ordered in ED Medications - No data to display  ED Course/ Medical Decision Making/ A&P Clinical Course as of  07/10/23 1011  Wed Jul 10, 2023  0930 CBC(!) Elevated hemoglobin.  No leukocytosis to suggest systemic infection. [TY]  0931 I-stat chem 8, ED(!) No significant electrolyte abnormalities. [TY]  1004 MRI negative for acute stroke.  Other labs largely reassuring.  EKG normal sinus rhythm without ST segment changes indicate ischemia.  No significant metabolic derangements.  Minor elevation in creatinine.  Neurology recommends admission to hospitalist for TIA workup. [TY]    Clinical Course User Index [TY] Coral Spikes, DO                             Medical Decision Making 57 year old male present emergency department with numbness to his left upper and lower extremity.  He is hypertensive 152/91.  Nontachycardic.  On physical exam, continues to have some subjective paresthesia in his left upper extremity.  Upgraded to code stroke.  Last known normal 7:30 AM.  Neurology bedside evaluating patient.  Present for CT scan, no acute pathology on my note pendent interpretation of images.  Patient to MRI now.    Amount and/or Complexity of Data Reviewed Independent Historian: spouse    Details: Noted patient with abnormal gait External Data Reviewed: notes.    Details: It appears last year patient had right-sided numbness.  Negative Ct and CTA head at that time.  Labs: ordered. Decision-making details documented in ED Course. Radiology: ordered. Decision-making details documented in ED Course.  Risk Decision regarding hospitalization.         Final Clinical Impression(s) / ED Diagnoses Final diagnoses:  Stroke-like symptoms    Rx / DC Orders ED Discharge Orders     None         Coral Spikes, DO 07/10/23 1011

## 2023-07-10 NOTE — ED Notes (Signed)
ED TO INPATIENT HANDOFF REPORT  ED Nurse Name and Phone #: Theophilus Bones [phone]  S Name/Age/Gender Matthew Mooney. 57 y.o. male Room/Bed: 027C/027C  Code Status   Code Status: Not on file  Home/SNF/Other Home Patient oriented to: self, place, time, and situation Is this baseline? Yes   Triage Complete: Triage complete  Chief Complaint stroke like sx  Triage Note Pt present to ED with c/o left arm numbness and tingling, headache. Pt states onset of symptoms 0700 while driving. Pt c/o intermittent dizziness, headache for two weeks. Pt A&Ox4 at this time.    Allergies Allergies  Allergen Reactions   Adhesive [Tape] Other (See Comments)    Patient states "it ripped my skin when it came off"    Level of Care/Admitting Diagnosis ED Disposition     ED Disposition  Admit   Condition  --   Comment  The patient appears reasonably stabilized for admission considering the current resources, flow, and capabilities available in the ED at this time, and I doubt any other [facility] requiring further screening and/or treatment in the ED prior to admission is  present.          B Medical/Surgery History Past Medical History:  Diagnosis Date   Anxiety    Arthritis    Depression    History of kidney stones    Past Surgical History:  Procedure Laterality Date   EXTRACORPOREAL SHOCK WAVE LITHOTRIPSY Right 08/28/2021   Procedure: EXTRACORPOREAL SHOCK WAVE LITHOTRIPSY (ESWL);  Surgeon: Belva Agee, MD;  Location: [facility];  Service: Urology;  Laterality: Right;   SHOULDER ARTHROSCOPY W/ ROTATOR CUFF REPAIR Left 2019   SHOULDER ARTHROSCOPY WITH ROTATOR CUFF REPAIR AND SUBACROMIAL DECOMPRESSION Right 06/13/2020   Procedure: ARTHROSCOPY SHOULDER ROTATOR CUFF REPAIR, DEBRIDEMENT, SUBACROMIAL DECOMPRESSION;  Surgeon: Jones Broom, MD;  Location: [facility];  Service: Orthopedics;  Laterality: Right;   TONSILLECTOMY       A IV  Location/Drains/Wounds Patient Lines/Drains/Airways Status     Active Line/Drains/Airways     Name Placement date Placement time Site Days   Peripheral IV 07/10/23 18 G 1.16" Right Antecubital 07/10/23  0909  Antecubital  less than 1   Incision (Closed) 06/13/20 Shoulder Right 06/13/20  1403  -- 1122            Intake/Output Last 24 hours No intake or output data in the 24 hours ending 07/10/23 1043  Labs/Imaging Results for orders placed or performed during the hospital encounter of 07/10/23 (from the past 48 hour(s))  CBG monitoring, ED     Status: None   Collection Time: 07/10/23  9:03 AM  Result Value Ref Range   Glucose-Capillary 99 70 - 99 mg/dL    Comment: Glucose reference range applies only to samples taken after fasting for at least 8 hours.  Protime-INR     Status: None   Collection Time: 07/10/23  9:11 AM  Result Value Ref Range   Prothrombin Time 13.3 11.4 - 15.2 seconds   INR 1.0 0.8 - 1.2    Comment: (NOTE) INR goal varies based on device and disease states. Performed at [facility], [address], [place], [place] [address]   APTT     Status: None   Collection Time: 07/10/23  9:11 AM  Result Value Ref Range   aPTT 30 24 - 36 seconds    Comment: Performed at [facility], [address] [place], [place] [address]  CBC  Status: Abnormal   Collection Time: 07/10/23  9:11 AM  Result Value Ref Range   WBC 7.3 4.0 - 10.5 K/uL   RBC 6.14 (H) 4.22 - 5.81 MIL/uL   Hemoglobin 18.3 (H) 13.0 - 17.0 g/dL   HCT 28.4 (H) 13.2 - 44.0 %   MCV 90.1 80.0 - 100.0 fL   MCH 29.8 26.0 - 34.0 pg   MCHC 33.1 30.0 - 36.0 g/dL   RDW 10.2 72.5 - 36.6 %   Platelets 218 150 - 400 K/uL   nRBC 0.0 0.0 - 0.2 %    Comment: Performed at [facility], [address] [place], [place] [address]  Differential     Status: None   Collection Time: 07/10/23  9:11 AM  Result Value Ref Range   Neutrophils Relative % 62 %   Neutro Abs 4.6 1.7 - 7.7 K/uL    Lymphocytes Relative 27 %   Lymphs Abs 2.0 0.7 - 4.0 K/uL   Monocytes Relative 9 %   Monocytes Absolute 0.6 0.1 - 1.0 K/uL   Eosinophils Relative 1 %   Eosinophils Absolute 0.1 0.0 - 0.5 K/uL   Basophils Relative 1 %   Basophils Absolute 0.1 0.0 - 0.1 K/uL   Immature Granulocytes 0 %   Abs Immature Granulocytes 0.03 0.00 - 0.07 K/uL    Comment: Performed at [facility], [address] [place], [place] [address]  Comprehensive metabolic panel     Status: Abnormal   Collection Time: 07/10/23  9:11 AM  Result Value Ref Range   Sodium 137 135 - 145 mmol/L   Potassium 4.0 3.5 - 5.1 mmol/L   Chloride 106 98 - 111 mmol/L   CO2 27 22 - 32 mmol/L   Glucose, Bld 90 70 - 99 mg/dL    Comment: Glucose reference range applies only to samples taken after fasting for at least 8 hours.   BUN 29 (H) 6 - 20 mg/dL   Creatinine, Ser 5.63 (H) 0.61 - 1.24 mg/dL   Calcium 8.7 (L) 8.9 - 10.3 mg/dL   Total Protein 6.7 6.5 - 8.1 g/dL   Albumin 4.2 3.5 - 5.0 g/dL   AST 25 15 - 41 U/L   ALT 42 0 - 44 U/L   Alkaline Phosphatase 63 38 - 126 U/L   Total Bilirubin 1.0 0.3 - 1.2 mg/dL   GFR, Estimated 56 (L) >60 mL/min    Comment: (NOTE) Calculated using the CKD-EPI Creatinine Equation (2021)    Anion gap 4 (L) 5 - 15    Comment: Performed at [facility], [address], [place], [place] [address]  Ethanol     Status: None   Collection Time: 07/10/23  9:11 AM  Result Value Ref Range   Alcohol, Ethyl (B) <10 <10 mg/dL    Comment: (NOTE) Lowest detectable limit for serum alcohol is 10 mg/dL.  For medical purposes only. Performed at [facility], [address], [place], [place] [address]   I-stat chem 8, ED     Status: Abnormal   Collection Time: 07/10/23  9:18 AM  Result Value Ref Range   Sodium 140 135 - 145 mmol/L   Potassium 4.0 3.5 - 5.1 mmol/L   Chloride 101 98 - 111 mmol/L   BUN 31 (H) 6 - 20 mg/dL   Creatinine, Ser 1.88 (H) 0.61 - 1.24 mg/dL   Glucose, Bld 87 70 - 99  mg/dL    Comment: Glucose reference  range applies only to samples taken after fasting for at least 8 hours.   Calcium, Ion 1.15 1.15 - 1.40 mmol/L   TCO2 29 22 - 32 mmol/L   Hemoglobin 18.4 (H) 13.0 - 17.0 g/dL   HCT 29.5 (H) 62.1 - 30.8 %   MR BRAIN WO CONTRAST  Result Date: 07/10/2023 CLINICAL DATA:  Neuro deficit, acute, stroke suspected. Weakness and numbness of the left side. EXAM: MRI HEAD WITHOUT CONTRAST TECHNIQUE: Multiplanar, multiecho pulse sequences of the brain and surrounding structures were obtained without intravenous contrast. COMPARISON:  Head CT same day.  MRI 12/30/2006. FINDINGS: Brain: Diffusion imaging does not show any acute or subacute infarction. No abnormality affects the brainstem or cerebellum. Cerebral hemispheres show a few punctate foci of T2 and FLAIR signal in the white matter, not likely significant. No cortical or large vessel territory insult. No evidence of thalamic stroke. No mass, hemorrhage, hydrocephalus or extra-axial collection. Vascular: Major vessels at the base of the brain show flow. Skull and upper cervical spine: Negative Sinuses/Orbits: Clear/normal Other: None IMPRESSION: No acute or reversible finding. No cause of the presenting symptoms is identified. Few punctate foci of T2 and FLAIR signal in the cerebral hemispheric white matter, not likely significant. Electronically Signed   By: Paulina Fusi M.D.   On: 07/10/2023 09:50   CT HEAD CODE STROKE WO CONTRAST  Result Date: 07/10/2023 CLINICAL DATA:  Code stroke. Neuro deficit, acute, stroke suspected. Left-sided weakness. Dizziness. EXAM: CT HEAD WITHOUT CONTRAST TECHNIQUE: Contiguous axial images were obtained from the base of the skull through the vertex without intravenous contrast. RADIATION DOSE REDUCTION: This exam was performed according to the departmental dose-optimization program which includes automated exposure control, adjustment of the mA and/or kV according to patient size and/or use of  iterative reconstruction technique. COMPARISON:  CT HEAD WITHOUT CONTRAST 02/03/2022 FINDINGS: Brain: No acute infarct, hemorrhage, or mass lesion is present. No significant white matter lesions are present. The ventricles are of normal size. No significant extraaxial fluid collection is present. Deep brain nuclei are within normal limits. The brainstem and cerebellum are within normal limits. Midline structures are within normal limits. Vascular: No hyperdense vessel or unexpected calcification. Skull: Calvarium is intact. No focal lytic or blastic lesions are present. No significant extracranial soft tissue lesion is present. Sinuses/Orbits: The remote right orbital blowout fracture is noted. The globes and orbits are otherwise within normal limits. The paranasal sinuses and mastoid air cells are clear. Other: ASPECTS (Alberta Stroke Program Early CT Score) - Ganglionic level infarction (caudate, lentiform nuclei, internal capsule, insula, M1-M3 cortex): 7/7 - Supraganglionic infarction (M4-M6 cortex): 3/3 Total score (0-10 with 10 being normal): 10/10 IMPRESSION: 1. Negative CT of the head. 2. Aspects is 10/10. 3. Remote right orbital blowout fracture. The above was relayed via text pager to Dr. Wilford Corner on 07/10/2023 at 09:26 . Electronically Signed   By: Marin Roberts M.D.   On: 07/10/2023 09:27    Pending Labs Unresulted Labs (From admission, onward)    None       Vitals/Pain Today's Vitals   07/10/23 0900 07/10/23 0901  BP: (!) 152/91   Pulse: 88   Resp: 20   SpO2: 98%   Weight:  87.8 kg  PainSc:  0-No pain    Isolation Precautions No active isolations  Medications Medications - No data to display  Mobility walks     Focused Assessments Neuro Assessment Handoff:  Swallow screen pass? Yes    NIH Stroke Scale  Dizziness  Present: Yes Headache Present: Yes Interval: Initial Level of Consciousness (1a.)   : Alert, keenly responsive LOC Questions (1b. )   : Answers both  questions correctly LOC Commands (1c. )   : Performs both tasks correctly Best Gaze (2. )  : Normal Visual (3. )  : No visual loss Facial Palsy (4. )    : Normal symmetrical movements Motor Arm, Left (5a. )   : No drift Motor Arm, Right (5b. ) : No drift Motor Leg, Left (6a. )  : No drift Motor Leg, Right (6b. ) : No drift Limb Ataxia (7. ): Present in one limb Sensory (8. )  : Mild-to-moderate sensory loss, patient feels pinprick is less sharp or is dull on the affected side, or there is a loss of superficial pain with pinprick, but patient is aware of being touched Best Language (9. )  : No aphasia Dysarthria (10. ): Normal Extinction/Inattention (11.)   : No Abnormality Complete NIHSS TOTAL: 2 Last date known well: 07/10/23 Last time known well: 0700 Neuro Assessment:   Neuro Checks:   Initial (07/10/23 0915)  Has TPA been given? No If patient is a Neuro Trauma and patient is going to OR before floor call report to 4N Charge nurse: [phone] or [phone]   R Recommendations: See Admitting Provider Note  Report given to:   Additional Notes:

## 2023-07-10 NOTE — Progress Notes (Signed)
Patient just arrived to unit. Got him acclimated. Bed is locked in the lowest position. Alarm on and call bell is within reach.

## 2023-07-10 NOTE — ED Triage Notes (Signed)
Pt present to ED with c/o left arm numbness and tingling, headache. Pt states onset of symptoms 0700 while driving. Pt c/o intermittent dizziness, headache for two weeks. Pt A&Ox4 at this time.

## 2023-07-10 NOTE — H&P (Cosign Needed Addendum)
Date: 07/10/2023               Patient Name:  Matthew Mooney. MRN: [id number]  DOB: [birth year] Age / Sex: 57 y.o., male   PCP: Lise Auer, MD         Medical Service: Internal Medicine Teaching Service         Attending Physician: Dr. Reymundo Poll, MD      First Contact: Jac Canavan MSIV                 Second Contact: Morene Crocker, MD    Pager: [facility] [phone]   Chief Complaint: Stroke like symptoms  History of Present Illness:  Pt is a 57 year old male with PMHx of hypogonadism on HRT, GAD, MDD, and OA presenting to the ED with stroke like symptoms. His symptoms included alternation in sensation and left sided weakness. Symptoms started this morning but have been intermittently going on for 2 weeks. Wife reports pt has blank stares intermittently over the last 2 weeks and has complained of fatigue. Only pertinent neurologic hx was head trauma 8 years ago where pt fell from the roof. He was not hospitalized for this.   Pt follows with Dr. Welton Flakes and was last seen around one month ago. Pt has been on HRT for 15 years.   Review of Systems negative unless stated in the HPI.  In the ED, code stroke was called. Pt got imaging that was negative for stroke so pt was admitted for TIA work up.   Past Medical History: Past Medical History:  Diagnosis Date   Anxiety    Arthritis    Depression    History of kidney stones     Meds: Current Outpatient Medications  Medication Instructions   amitriptyline (ELAVIL) 25 MG tablet Take 1 (one) Tablet at bedtime for SLEEP   aspirin EC 81 mg, Oral, Every evening, Swallow whole.    celecoxib (CELEBREX) 200 MG capsule Take 1 capsule (200 mg total) by mouth 2 (two) times daily for arthritis   celecoxib (CELEBREX) 200 MG capsule Take 1 capsule by mouth 2 times a day for arthritis   celecoxib (CELEBREX) 200 MG capsule Take 1 capsule by mouth twice daily for arthritis.   celecoxib (CELEBREX) 200 MG capsule Take 1  capsule (200 mg total) by mouth 2 (two) times daily for ARTHRITIS   cholecalciferol (VITAMIN D3) 1,000 Units, Oral, Daily   fluorouracil (EFUDEX) 5 % cream Apply 1 application on the skin twice a day for 2 weeks.   FLUoxetine (PROZAC) 20 MG capsule Take 1 capsule (20 mg total) by mouth daily.  Need office visit   FLUoxetine (PROZAC) 20 mg, Oral, Daily   MAGNESIUM PO 1 tablet, Oral, Daily   Multiple Vitamin (MULTIVITAMIN ADULT PO) 1 tablet, Oral, Daily   mupirocin ointment (BACTROBAN) 2 % Apply topically to  the skin DAILY To surgical site as directed   Omega-3 Fatty Acids (FISH OIL OMEGA-3 PO) 1 capsule, Oral, Daily   Testosterone 1.62 % GEL 4 Pump, Topical, Daily   Testosterone 20.25 MG/ACT (1.62%) GEL Apply 4 Pump on skin daily   Testosterone 20.25 MG/ACT (1.62%) GEL Apply 4 pumps on skin daily   Testosterone 20.25 MG/ACT (1.62%) GEL Apply 4 pumps topically onto the skin daily.   tiZANidine (ZANAFLEX) 4 mg, Oral, Every 8 hours PRN   traMADol (ULTRAM) 50 mg, Oral, Every 12 hours PRN   traMADol-acetaminophen (ULTRACET) 37.5-325 MG tablet Take 1-2  tablets by mouth at bedtime as needed for pain   traMADol-acetaminophen (ULTRACET) 37.5-325 MG tablet Take 1 to 2 tablets by mouth at bedtime, as needed for PAIN   traMADol-acetaminophen (ULTRACET) 37.5-325 MG tablet Take 1 to 2 tablets by mouth 2 times daily as needed for joint pain   traMADol-acetaminophen (ULTRACET) 37.5-325 MG tablet Take 1-2 tablets by mouth 2 (two) times daily as needed for joint pain.   venlafaxine XR (EFFEXOR-XR) 150 MG 24 hr capsule Take 1 capsule by mouth once every morning  Does not take effexor  Allergies: Allergies as of 07/10/2023 - Review Complete 07/10/2023  Allergen Reaction Noted   Adhesive [tape] Other (See Comments) 06/13/2020    Past Surgical History: Past Surgical History:  Procedure Laterality Date   EXTRACORPOREAL SHOCK WAVE LITHOTRIPSY Right 08/28/2021   Procedure: EXTRACORPOREAL SHOCK WAVE LITHOTRIPSY  (ESWL);  Surgeon: Belva Agee, MD;  Location: [facility];  Service: Urology;  Laterality: Right;   SHOULDER ARTHROSCOPY W/ ROTATOR CUFF REPAIR Left 2019   SHOULDER ARTHROSCOPY WITH ROTATOR CUFF REPAIR AND SUBACROMIAL DECOMPRESSION Right 06/13/2020   Procedure: ARTHROSCOPY SHOULDER ROTATOR CUFF REPAIR, DEBRIDEMENT, SUBACROMIAL DECOMPRESSION;  Surgeon: Jones Broom, MD;  Location: [facility];  Service: Orthopedics;  Laterality: Right;   TONSILLECTOMY      Family History:  DMII-father Mother: HTN Brother: Wegners, and HTN GM: HTN  Social History:  Lives with wiffe. Kids live next door. 4 dogs; french bulldogs [place] in [place].  No tobacco use, very rare alcohol use.  Illicit drug use- denies use.  IADLs/ADLs- can person independently at baseline   Physical Exam: Blood pressure (!) 165/91, pulse 87, temperature 98 F (36.7 C), resp. rate 18, height 5\' 6"  (1.676 m), weight 87.8 kg, SpO2 95%. General: NAD HENT: NCAT Lungs: CTAB Cardiovascular: NSR, good pulses in all extremities  Abdomen: No TTP, normal bowel sounds MSK: no asymmetry, good strength in all extremities Skin: no lesions on skin. Fore foot that is pale but is at baseline with good perfusion Neuro: alert and orientex x4. CNII-XII intact. Good strength and sensation throughout. States he feels back to baseline.  Psych: flat affect  Diagnostics:     Latest Ref Rng & Units 07/10/2023    9:18 AM 07/10/2023    9:11 AM 02/03/2022   11:50 PM  CBC  WBC 4.0 - 10.5 K/uL  7.3  12.1   Hemoglobin 13.0 - 17.0 g/dL 06.2  69.4  85.4   Hematocrit 39.0 - 52.0 % 54.0  55.3  50.4   Platelets 150 - 400 K/uL  218  243        Latest Ref Rng & Units 07/10/2023    9:18 AM 07/10/2023    9:11 AM 02/03/2022   11:50 PM  CMP  Glucose 70 - 99 mg/dL 87  90  93   BUN 6 - 20 mg/dL 31  29  29    Creatinine 0.61 - 1.24 mg/dL 6.27  0.35  0.09   Sodium 135 - 145 mmol/L 140  137  139   Potassium 3.5 - 5.1  mmol/L 4.0  4.0  4.0   Chloride 98 - 111 mmol/L 101  106  103   CO2 22 - 32 mmol/L  27  26   Calcium 8.9 - 10.3 mg/dL  8.7  9.3   Total Protein 6.5 - 8.1 g/dL  6.7  6.3   Total Bilirubin 0.3 - 1.2 mg/dL  1.0  0.3   Alkaline Phos 38 -  126 U/L  63  63   AST 15 - 41 U/L  25  17   ALT 0 - 44 U/L  42  33     MR BRAIN WO CONTRAST  Result Date: 07/10/2023 CLINICAL DATA:  Neuro deficit, acute, stroke suspected. Weakness and numbness of the left side. EXAM: MRI HEAD WITHOUT CONTRAST IMPRESSION: No acute or reversible finding. No cause of the presenting symptoms is identified. Few punctate foci of T2 and FLAIR signal in the cerebral hemispheric white matter, not likely significant. Electronically Signed   By: Paulina Fusi M.D.   On: 07/10/2023 09:50   CT HEAD CODE STROKE WO CONTRAST  Result Date: 07/10/2023 CLINICAL DATA:  Code stroke. Neuro deficit, acute, stroke suspected. Left-sided weakness. Dizziness. EXAM: CT HEAD WITHOUT CONTRAST IMPRESSION: 1. Negative CT of the head. 2. Aspects is 10/10. 3. Remote right orbital blowout fracture. The above was relayed via text pager to Dr. Wilford Corner on 07/10/2023 at 09:26 . Electronically Signed   By: Marin Roberts M.D.   On: 07/10/2023 09:27     EKG: personally reviewed my interpretation is NSR with non-specific ST changes.   Assessment & Plan by Problem:  Present on Admission:  Transient ischemic attack (TIA)   Transient Ischemic Attack Pt's presentation was concerning for CVA but imaging negative making it a TIA. Possible etiologies could be uncontrolled risk factors vs testosterone therapy. Testosterone therapy increases risk of DVT which can lead to CVA. No sign of DVT on exam. Will check echo for PFO and if present check for DVT. Pt's HCT is increased likely 2/2 to HRT which can predispose him to hypoperfusion especially if he has concomitant dehydration and intracranial stenosis (see below). Will complete initial work up and discuss the possibility  of HRT in this episode with neurology. Seizures were considered to explain pt's symptoms especially given his symptoms in the prior 2 weeks so EEG has been ordered. His previous head injury could be a cause seizure activity if present - Admit for TIA workup; Neurology consulted. Appreciate their assistance in taking care of this patient - Permissive HTN x48 hrs from sx onset goal BP <220/110. PRN labetalol or hydralazine if BP above these parameters. Avoid oral antihypertensives. - CTA head and neck - TTE w/ bubble - EEG spot study - Check A1c and LDL + added statin - Aspirin 81 mg daily  - q4 hr neuro checks - STAT head CT for any change in neuro exam  AKI vs CKDIIa Pt's creatinine elevated at 1.47. Last Creatinine is normal one year ago. This could be AKI vs CKDIIIa. Will get orthostatic vitals and give IVF. Will continue to trend creatinine daily. BMP ordered for tomorrow.   Chronic Problems MDD/GAD: Restart home meds Hypogonadism: Holding HRT for now.   DVT prophx: Lovenox Diet: Regular Bowel: PRN Code: Full  Prior to Admission Living Arrangement: Home Anticipated Discharge Location: Home Barriers to Discharge: Medical Workup  Dispo: Admit patient to Observation with expected length of stay less than 2 midnights.  Gwenevere Abbot, MD Eligha Bridegroom. [facility] Internal Medicine Residency, PGY-3 Pager: [phone]

## 2023-07-10 NOTE — Code Documentation (Signed)
Stroke Response Nurse Documentation Code Documentation  Matthew Mooney. is a 57 y.o. male arriving to [facility]  via [employer] on 07/10/23 with past medical hx of depression, anxiety, kidney stones, currently on testosterone . On No antithrombotic. Code stroke was activated by ED.   Patient was LKW at 0700 and around 0715 when he was driving to a restaurant started to feel numbness in his left side and dizziness. He arrived to the restaurant and his symptoms were worsening. Around 0745 he left and called his wife to tell her something was wrong and she needed to pick him up. He pulled over and waited for her to get him and take him to [facility]. Pt says he has had headaches and dizziness off and on for about 2 weeks.   Stroke team met the patient heading into CT after he had been taken there by triage nurse. Patient cleared for CT by Dr. Rosalia Hammers. NIHSS 2, see documentation for details and code stroke times. Patient with left limb ataxia and left decreased sensation on exam. The following imaging was completed:  CT Head and MRI. Patient is not a candidate for IV Thrombolytic due to MRI negative per MD. Patient is not a candidate for IR due to no LVO.    Care Plan: TIA workup, EEG, Q2 neuro checks for 12 hours, permissive HTN <220/120.   Bedside handoff with ED RN.    Modena Slater  Stroke Response RN [phone]

## 2023-07-10 NOTE — Consult Note (Addendum)
Neurology Consultation  Reason for Consult: Left arm and leg numbness and weakness Referring Physician: Dr. Maple Hudson  CC: Left arm numbness and weakness  History is obtained from: Chart, patient  HPI: Matthew Mooney. is a 57 y.o. male past medical history of anxiety, depression, presenting to the emergency department for evaluation of sudden onset of left-sided numbness and weakness symptoms.  Reports he has been also feeling dizzy since this morning. Reports waking up normally this a.m., started driving to work and felt something was not right.  Felt dizzy-lightheaded as well as numb and weak on the left arm and leg.  He was sitting at a restaurant when he had a sudden onset of slumping to the left because he was extremely weak.  He said at that point his arm felt as if he had had a nerve block, similar to what he had had with his shoulder surgeries.  He could not feel the money in his pocket with his hand.  The symptoms of resolved to the point where they are now nearly back to baseline but not 100% back to baseline. NIH 2. Code stroke was activated since he is within the window with last known well around 7:15 AM. Denies any visual symptoms.  Denies chest pain nausea vomiting. Denies shortness of breath, palpitations.  Denies fevers chills or preceding illnesses.     LKW: 7:15 AM IV thrombolysis given?: no, too mild to treat/rapidly improving symptoms EVT:  no, no LVO Premorbid modified Rankin scale (mRS): 0   ROS: Full ROS was performed and is negative except as noted in the HPI.   Past Medical History:  Diagnosis Date   Anxiety    Arthritis    Depression    History of kidney stones    No family history on file.  Social History:   reports that he has never smoked. He has never used smokeless tobacco. He reports that he does not drink alcohol and does not use drugs.  Medications No current facility-administered medications for this encounter.  Current Outpatient  Medications:    amitriptyline (ELAVIL) 25 MG tablet, TAKE 1 TABLET BY MOUTH AT BEDTIME AS NEEDED FOR SLEEP *NEEDS OFFICE VISIT FOR MORE REFILLS, Disp: 90 tablet, Rfl: 3   amitriptyline (ELAVIL) 25 MG tablet, Take 1 (one) Tablet at bedtime for SLEEP, Disp: 90 tablet, Rfl: 3   aspirin EC 81 MG tablet, Take 81 mg by mouth daily. Swallow whole., Disp: , Rfl:    celecoxib (CELEBREX) 100 MG capsule, Take 100 mg by mouth 2 (two) times daily., Disp: , Rfl:    celecoxib (CELEBREX) 200 MG capsule, Take 1 capsule (200 mg total) by mouth 2 (two) times daily for arthritis, Disp: 180 capsule, Rfl: 1   celecoxib (CELEBREX) 200 MG capsule, Take 1 capsule by mouth 2 times a day for arthritis, Disp: 180 capsule, Rfl: 1   celecoxib (CELEBREX) 200 MG capsule, Take 1 capsule by mouth twice daily for arthritis., Disp: 180 capsule, Rfl: 1   celecoxib (CELEBREX) 200 MG capsule, Take 1 capsule (200 mg total) by mouth 2 (two) times daily for ARTHRITIS, Disp: 180 capsule, Rfl: 1   cholecalciferol (VITAMIN D3) 25 MCG (1000 UNIT) tablet, Take 1,000 Units by mouth daily., Disp: , Rfl:    fluorouracil (EFUDEX) 5 % cream, Apply 1 application on the skin twice a day for 2 weeks., Disp: 40 g, Rfl: 0   FLUoxetine (PROZAC) 20 MG capsule, Take 1 capsule (20 mg total) by mouth daily.  Need office visit, Disp: 90 capsule, Rfl: 3   FLUoxetine (PROZAC) 20 MG capsule, TAKE 1 CAPSULE BY MOUTH DAILY * NEEDS OFFICE VISIT FOR MORE REFILLS, Disp: 90 capsule, Rfl: 3   FLUoxetine (PROZAC) 20 MG capsule, Take 1 capsule (20 mg total) by mouth daily., Disp: 90 capsule, Rfl: 3   FLUoxetine (PROZAC) 40 MG capsule, Take 40 mg by mouth daily., Disp: , Rfl:    MAGNESIUM PO, Take by mouth., Disp: , Rfl:    Multiple Vitamin (MULTIVITAMIN ADULT PO), Take by mouth., Disp: , Rfl:    mupirocin ointment (BACTROBAN) 2 %, Apply topically to  the skin DAILY To surgical site as directed, Disp: 22 g, Rfl: 0   Omega-3 Fatty Acids (FISH OIL OMEGA-3 PO), Take 1 capsule  by mouth., Disp: , Rfl:    testosterone (ANDROGEL) 50 MG/5GM (1%) GEL, Place 5 g onto the skin daily., Disp: , Rfl:    Testosterone 1.62 % GEL, APPLY 4 PUMPS ONTO SKIN ONCE A DAY, Disp: 450 g, Rfl: 0   Testosterone 1.62 % GEL, Apply 4 Pump topically daily., Disp: 450 g, Rfl: 2   Testosterone 20.25 MG/ACT (1.62%) GEL, Apply 4 Pump on skin daily, Disp: 450 g, Rfl: 0   Testosterone 20.25 MG/ACT (1.62%) GEL, Apply 4 pumps on skin daily, Disp: 450 g, Rfl: 2   Testosterone 20.25 MG/ACT (1.62%) GEL, Apply 4 pumps topically onto the skin daily., Disp: 450 g, Rfl: 2   traMADol (ULTRAM) 50 MG tablet, Take 50 mg by mouth every 6 (six) hours as needed., Disp: , Rfl:    traMADol-acetaminophen (ULTRACET) 37.5-325 MG tablet, Take 1-2 tablets by mouth at bedtime as needed for pain, Disp: 90 tablet, Rfl: 1   traMADol-acetaminophen (ULTRACET) 37.5-325 MG tablet, Take 1 to 2 tablets by mouth at bedtime, as needed for PAIN, Disp: 90 tablet, Rfl: 1   traMADol-acetaminophen (ULTRACET) 37.5-325 MG tablet, Take 1 to 2 tablets by mouth 2 times daily as needed for joint pain, Disp: 180 tablet, Rfl: 2   traMADol-acetaminophen (ULTRACET) 37.5-325 MG tablet, Take 1-2 tablets by mouth 2 (two) times daily as needed for joint pain., Disp: 180 tablet, Rfl: 2   venlafaxine XR (EFFEXOR-XR) 150 MG 24 hr capsule, Take 1 capsule by mouth once every morning, Disp: 90 capsule, Rfl: 2  Exam: Current vital signs: BP (!) 152/91 (BP Location: Right Arm)   Pulse 88   Resp 20   Wt 87.8 kg   SpO2 98%   BMI 31.24 kg/m  Vital signs in last 24 hours: Pulse Rate:  [88] 88 (07/24 0900) Resp:  [20] 20 (07/24 0900) BP: (152)/(91) 152/91 (07/24 0900) SpO2:  [98 %] 98 % (07/24 0900) Weight:  [87.8 kg] 87.8 kg (07/24 0901)  GENERAL: Awake, alert in NAD HEENT: - Normocephalic and atraumatic, dry mm LUNGS - unlabored, room air CV - S1S2 RRR, no m/r/g, equal pulses bilaterally. ABDOMEN - Soft, nontender, nondistended with normoactive  BS Ext: warm, well perfused, intact peripheral pulses, no edema  NEURO:  Mental Status: AA&Ox3  Language: speech is nondysarthric.  Naming, repetition, fluency, and comprehension intact. Cranial Nerves: PERRL EOMI, visual fields full, no facial asymmetry, facial sensation intact, hearing intact, tongue/uvula/soft palate midline, normal sternocleidomastoid and trapezius muscle strength. No evidence of tongue atrophy or fibrillations Motor: 5/5 with no drift in any of the 4 extremities Tone: is normal and bulk is normal Sensation- transient decreased sensation to LUE Coordination: FTN intact bilaterally, slight ataxia in LLE.  Gait- deferred  NIHSS-0 1a Level of Conscious.: 0 1b LOC Questions: 0 1c LOC Commands: 0 2 Best Gaze: 0 3 Visual: 0 4 Facial Palsy: 0 5a Motor Arm - left: 0 5b Motor Arm - Right: 0 6a Motor Leg - Left: 0 6b Motor Leg - Right: 0 7 Limb Ataxia: 1 8 Sensory: 1 9 Best Language: 0 10 Dysarthria: 0 11 Extinct. and Inatten.: 0 TOTAL: 2   Labs I have reviewed labs in epic and the results pertinent to this consultation are:  CBG: 99 BUN/Cr: 31/1.60   CBC    Component Value Date/Time   WBC 12.1 (H) 02/03/2022 2350   RBC 5.76 02/03/2022 2350   HGB 16.9 02/03/2022 2350   HCT 50.4 02/03/2022 2350   PLT 243 02/03/2022 2350   MCV 87.5 02/03/2022 2350   MCH 29.3 02/03/2022 2350   MCHC 33.5 02/03/2022 2350   RDW 12.7 02/03/2022 2350   LYMPHSABS 4.0 02/03/2022 2350   MONOABS 1.2 (H) 02/03/2022 2350   EOSABS 0.1 02/03/2022 2350   BASOSABS 0.1 02/03/2022 2350    CMP     Component Value Date/Time   NA 139 02/03/2022 2350   K 4.0 02/03/2022 2350   CL 103 02/03/2022 2350   CO2 26 02/03/2022 2350   GLUCOSE 93 02/03/2022 2350   BUN 29 (H) 02/03/2022 2350   CREATININE 0.98 02/03/2022 2350   CALCIUM 9.3 02/03/2022 2350   PROT 6.3 (L) 02/03/2022 2350   ALBUMIN 4.4 02/03/2022 2350   AST 17 02/03/2022 2350   ALT 33 02/03/2022 2350   ALKPHOS 63 02/03/2022  2350   BILITOT 0.3 02/03/2022 2350   GFRNONAA >60 02/03/2022 2350    Imaging I have reviewed the images obtained:  CT-head-no acute changes Stat MRI of the brain-no acute changes   Assessment:  57 year old with history of anxiety depression presenting to the emergency department for sudden onset of left-sided weakness and numbness along with dizziness.  Symptoms started suddenly while he was driving to work around 1:61 AM.  They worsen before starting to get better and are nearly resolved now. He had an episode where he had some right-sided paresthesias a year ago but says these left-sided symptoms today are much worse and much more prolonged. Noncontrasted head CT-unremarkable Due to concern for a small stroke, stat MRI was performed and reviewed-no evidence of acute stroke. Due to prior right-sided symptoms and now transient left-sided symptoms, will recommend admission for TIA workup. Other differentials could include atypical seizures-will also include an EEG in the workup  Recommendations: Admit to hospitalist Frequent neurochecks Telemetry Aspirin 81 Atorvastatin 80 CT angiography head and neck Transthoracic echo A1c Lipid panel PT OT Speech therapy Permissive hypertension-allow blood pressures to be as high as 220 before treating on a as needed basis Routine EEG Stroke team to follow Plan relayed to Dr. Maple Hudson  -- Milon Dikes, MD Neurologist [facility] Pager: [phone]

## 2023-07-10 NOTE — Procedures (Signed)
Patient Name: Matthew Mooney.  MRN: [id number]  Epilepsy Attending: Charlsie Quest  Referring Physician/Provider: Gwenevere Abbot, MD  Date: 07/10/2023 Duration: 23.36 mins  Patient history: 57 year old with history of anxiety depression presenting to the emergency department for sudden onset of left-sided weakness and numbness along with dizziness. EEG to evaluate for seizure  Level of alertness: Awake, asleep  AEDs during EEG study: None  Technical aspects: This EEG study was done with scalp electrodes positioned according to the 10-20 International system of electrode placement. Electrical activity was reviewed with band pass filter of 1-70Hz , sensitivity of 7 uV/mm, display speed of 24mm/sec with a 60Hz  notched filter applied as appropriate. EEG data were recorded continuously and digitally stored.  Video monitoring was available and reviewed as appropriate.  Description: The posterior dominant rhythm consists of 8-9 Hz activity of moderate voltage (25-35 uV) seen predominantly in posterior head regions, symmetric and reactive to eye opening and eye closing. Sleep was characterized by vertex waves, sleep spindles (12 to 14 Hz), maximal frontocentral region. Hyperventilation and photic stimulation were not performed.     EKG artifact was sen during this study.  IMPRESSION: This study is within normal limits. No seizures or epileptiform discharges were seen throughout the recording.  A normal interictal EEG does not exclude the diagnosis of epilepsy.  Nikkita Adeyemi Annabelle Harman

## 2023-07-10 NOTE — Progress Notes (Signed)
EEG complete - results pending 

## 2023-07-11 ENCOUNTER — Other Ambulatory Visit (HOSPITAL_COMMUNITY): Payer: Self-pay

## 2023-07-11 ENCOUNTER — Other Ambulatory Visit: Payer: Self-pay

## 2023-07-11 ENCOUNTER — Observation Stay (HOSPITAL_COMMUNITY): Payer: 59

## 2023-07-11 DIAGNOSIS — Z8639 Personal history of other endocrine, nutritional and metabolic disease: Secondary | ICD-10-CM

## 2023-07-11 DIAGNOSIS — N179 Acute kidney failure, unspecified: Secondary | ICD-10-CM | POA: Insufficient documentation

## 2023-07-11 DIAGNOSIS — G459 Transient cerebral ischemic attack, unspecified: Secondary | ICD-10-CM | POA: Diagnosis not present

## 2023-07-11 LAB — BASIC METABOLIC PANEL
Anion gap: 8 (ref 5–15)
CO2: 29 mmol/L (ref 22–32)
Calcium: 8.5 mg/dL — ABNORMAL LOW (ref 8.9–10.3)
Chloride: 101 mmol/L (ref 98–111)
GFR, Estimated: >60 mL/min (ref >60)
Glucose, Bld: 105 mg/dL — ABNORMAL HIGH (ref 70–99)
Potassium: 4.4 mmol/L (ref 3.5–5.1)

## 2023-07-11 LAB — ECHOCARDIOGRAM COMPLETE BUBBLE STUDY
AR max vel: 2.18 cm2
AV Area VTI: 2.27 cm2
AV Area mean vel: 2.23 cm2
AV Peak grad: 17.1 mmHg
Ao pk vel: 2.07 m/s
Area-P 1/2: 3.76 cm2
Calc EF: 58 %
MV VTI: 4.1 cm2
P 1/2 time: 666 msec
S' Lateral: 3.7 cm
Single Plane A4C EF: 56.6 %

## 2023-07-11 LAB — CBC WITH DIFFERENTIAL/PLATELET
Abs Immature Granulocytes: 0.02 10*3/uL (ref 0.00–0.07)
Basophils Absolute: 0.1 10*3/uL (ref 0.0–0.1)
Basophils Relative: 1 %
Eosinophils Absolute: 0.1 10*3/uL (ref 0.0–0.5)
Eosinophils Relative: 1 %
HCT: 49.2 % (ref 39.0–52.0)
Hemoglobin: 16.5 g/dL (ref 13.0–17.0)
Immature Granulocytes: 0 %
Lymphocytes Relative: 34 %
Lymphs Abs: 2.5 10*3/uL (ref 0.7–4.0)
MCH: 31 pg (ref 26.0–34.0)
MCHC: 33.5 g/dL (ref 30.0–36.0)
MCV: 92.3 fL (ref 80.0–100.0)
Monocytes Absolute: 0.7 10*3/uL (ref 0.1–1.0)
Monocytes Relative: 9 %
Neutro Abs: 4 10*3/uL (ref 1.7–7.7)
Neutrophils Relative %: 55 %
Platelets: 206 10*3/uL (ref 150–400)
RBC: 5.33 MIL/uL (ref 4.22–5.81)
RDW: 12.5 % (ref 11.5–15.5)
WBC: 7.3 10*3/uL (ref 4.0–10.5)
nRBC: 0 % (ref 0.0–0.2)

## 2023-07-11 LAB — CBC
Hemoglobin: 16.3 g/dL (ref 13.0–17.0)
MCH: 30.6 pg (ref 26.0–34.0)
MCHC: 33.5 g/dL (ref 30.0–36.0)
MCV: 91.4 fL (ref 80.0–100.0)
Platelets: 199 10*3/uL (ref 150–400)
RBC: 5.32 MIL/uL (ref 4.22–5.81)
RDW: 12.6 % (ref 11.5–15.5)
WBC: 7.3 10*3/uL (ref 4.0–10.5)
nRBC: 0 % (ref 0.0–0.2)

## 2023-07-11 MED ORDER — CLOPIDOGREL BISULFATE 75 MG PO TABS
75.0000 mg | ORAL_TABLET | Freq: Every day | ORAL | Status: DC
  Administered 2023-07-11: 75 mg via ORAL
  Filled 2023-07-11: qty 1

## 2023-07-11 MED ORDER — ACETAMINOPHEN 325 MG PO TABS: 650.0000 mg | ORAL_TABLET | Freq: Four times a day (QID) | ORAL | Status: AC | PRN

## 2023-07-11 MED ORDER — ASPIRIN 81 MG PO TBEC
81.0000 mg | DELAYED_RELEASE_TABLET | Freq: Every evening | ORAL | 0 refills | Status: AC
  Filled 2023-07-11: qty 21, 21d supply, fill #0

## 2023-07-11 MED ORDER — CLOPIDOGREL BISULFATE 75 MG PO TABS
75.0000 mg | ORAL_TABLET | Freq: Every day | ORAL | 2 refills | Status: DC
  Filled 2023-07-11: qty 30, 30d supply, fill #0

## 2023-07-11 MED ORDER — ATORVASTATIN CALCIUM 80 MG PO TABS
80.0000 mg | ORAL_TABLET | Freq: Every day | ORAL | 0 refills | Status: DC
  Filled 2023-07-11 (×2): qty 30, 30d supply, fill #0

## 2023-07-11 NOTE — Discharge Summary (Signed)
Name: Matthew Mooney. MRN: [id number] DOB: 08-09-1966 57 y.o. PCP: Lise Auer, MD  Date of Admission: 07/10/2023  8:46 AM Date of Discharge: 07/11/23 Attending Physician: Dr. Antony Contras  Discharge Diagnosis: Principal Problem:   Transient ischemic attack (TIA) Active Problems:   AKI (acute kidney injury) (HCC)   History of hypogonadism Stenosis of Right PCA Hyperlipidemia Elevated Blood Pressure   Discharge Medications: Allergies as of 07/11/2023       Reactions   Adhesive [tape] Other (See Comments)   Patient states "it ripped my skin when it came off"        Medication List     STOP taking these medications    celecoxib 200 MG capsule Commonly known as: CELEBREX   fluorouracil 5 % cream Commonly known as: EFUDEX   mupirocin ointment 2 % Commonly known as: BACTROBAN   traMADol-acetaminophen 37.5-325 MG tablet Commonly known as: ULTRACET   venlafaxine XR 150 MG 24 hr capsule Commonly known as: EFFEXOR-XR       TAKE these medications    acetaminophen 325 MG tablet Commonly known as: TYLENOL Take 2 tablets (650 mg total) by mouth every 6 (six) hours as needed for mild pain (or Fever >/= 101).   amitriptyline 25 MG tablet Commonly known as: ELAVIL Take 1 (one) Tablet at bedtime for SLEEP What changed:  how much to take how to take this when to take this   aspirin EC 81 MG tablet Take 1 tablet (81 mg total) by mouth every evening for 21 days. What changed: additional instructions   atorvastatin 80 MG tablet Commonly known as: LIPITOR Take 1 tablet (80 mg total) by mouth daily. Start taking on: July 12, 2023   cholecalciferol 25 MCG (1000 UNIT) tablet Commonly known as: VITAMIN D3 Take 1,000 Units by mouth daily.   clopidogrel 75 MG tablet Commonly known as: PLAVIX Take 1 tablet (75 mg total) by mouth daily.   FISH OIL OMEGA-3 PO Take 1 capsule by mouth daily.   FLUoxetine 20 MG capsule Commonly known as: PROZAC Take 1 capsule  (20 mg total) by mouth daily. What changed:  when to take this Another medication with the same name was removed. Continue taking this medication, and follow the directions you see here.   MAGNESIUM PO Take 1 tablet by mouth daily.   MULTIVITAMIN ADULT PO Take 1 tablet by mouth daily.   Testosterone 1.62 % Gel Apply 4 pumps on skin daily What changed:  how much to take how to take this when to take this Another medication with the same name was removed. Continue taking this medication, and follow the directions you see here.   tiZANidine 4 MG tablet Commonly known as: ZANAFLEX Take 4 mg by mouth every 8 (eight) hours as needed for muscle spasms.   traMADol 50 MG tablet Commonly known as: ULTRAM Take 50 mg by mouth every 12 (twelve) hours as needed for severe pain.        Disposition and follow-up:   [name]. was discharged from [facility] in Good condition.  At the hospital follow up visit please address:  1.  Ensure pt has neurology follow up. Ensure pt is complaint with his DAPT, and his statin.   2.  Labs / imaging needed at time of follow-up: CBC, BMP, Lipid panel in 6 weeks, Sleep study  3.  Pending labs/ test needing follow-up: None  Follow-up Appointments:  Follow-up Information     [facility] Follow up today.   Why: Call today or tomorrow to make an appointment Contact information: [address]     [address] [place] [address] [phone]                Hospital Course by problem list: History of Present Illness:  Pt is a 57 year old male with PMHx of hypogonadism on HRT, GAD, MDD, and OA presenting to the ED with stroke like symptoms. His symptoms included alternation in sensation and left sided weakness. Symptoms started this morning but have been intermittently going on for 2 weeks. Wife reports pt has blank stares intermittently over the last 2 weeks and has complained  of fatigue. Only pertinent neurologic hx was head trauma 8 years ago where pt fell from the roof. He was not hospitalized for this.  In the ED, code stroke was called. Pt got imaging that was negative for stroke so pt was admitted for TIA work up.   Transient Ischemic Attack Hyperlipidemia  Pt's presentation was concerning for CVA but imaging negative making it a TIA. Possible etiologies could be uncontrolled risk factors vs testosterone therapy. Testosterone therapy increases risk of DVT which can lead to CVA. No sign of DVT on exam. TTE with bubble study was negative for PFO and showed no structural or functional abnormalities of the patients heart. An incidental finding of Aortic dilation of 4cm was found on Echo. Patients A1c was WNL at 5.2. Lipid panel revealed Hyperlipidemia with LDL of 162. Pt's HCT is increased likely 2/2 to HRT which can predispose him to hypoperfusion especially if he has concomitant dehydration and intracranial stenosis (see below). HCT decreased to wnl with hydration.. Seizures were considered to explain pt's symptoms especially given his symptoms in the prior 2 weeks so EEG was ordered. EEG was negative for seizure activity. Neurology was consulted and recommended following up in 8 weeks in the stroke clinic.   At Risk for OSA Pt may need a sleep study given concern for increased fatigue and high STOP-BANG score.   Elevated Blood Pressures: BP noted to be elevated in the hospital but permissive HTN was allowed. Deferred starting antihypertensive in the hospital as it is best done in outpatient setting. Asked pt to follow up with PCP to discuss any need for anti-hypertensives. Will need optimal control given TIA.   AKI  Pt's creatinine was elevated at 1.47 but decreased to 1.05 with IVF overnight. This was likely due to dehydration.   Chronic Problems MDD/GAD: Restarted home meds Hypogonadism: Held initially but restarted prior to day of discharge  Discharge  Subjective: Patient reports that he feels well this morning. He says that his arm weakness has completely resolved but he still has a slight residual headache. The patient admitted that he does have headaches on occasion. He rated this headache a 2 or 3/10. The patient denies any extremity weakness, numbness, tingling, balance issues, vision changes, lightheadedness.   Discharge Exam:   BP 126/83   Pulse 79   Temp 98.4 F (36.9 C) (Oral)   Resp 18   Ht 5\' 6"  (1.676 m)   Wt 87.8 kg   SpO2 96%   BMI 31.24 kg/m  Physical Exam:  Constitutional- alert age appearing male resting comfortably in bed in no acute distress Cardio- heart RRR with present S1 and S2 with no murmurs, rubs or gallops. No carotid bruits auscultated. No JVD appreciated. No peripheral edema.  Pulmonary- lungs CTA in all fields. Respiratory effort  is normal.  Abdomen- soft and nontender. Bowel sounds present. MSK- no peripheral edema noted.   Pertinent Labs, Studies, and Procedures:  ECHOCARDIOGRAM COMPLETE BUBBLE STUDY  Result Date: 07/11/2023    ECHOCARDIOGRAM REPORT   Patient Name:   Matthew Mooney. Date of Exam: 07/11/2023 Medical Rec #:  [id number]           Height:       66.0 in Accession #:    [id number]          Weight:       193.6 lb Date of Birth:  Oct 26, 1966           BSA:          1.972 m Patient Age:    57 years            BP:           143/75 mmHg Patient Gender: M                   HR:           79 bpm. Exam Location:  Inpatient Procedure: 2D Echo, Cardiac Doppler, Color Doppler and Saline Contrast Bubble            Study Indications:    Stroke  History:        Patient has no prior history of Echocardiogram examinations.                 TIA.  Sonographer:    Wallie Char Referring Phys: [id number] TRAVIS J YOUNG IMPRESSIONS  1. Left ventricular ejection fraction, by estimation, is 60 to 65%. The left ventricle has normal function. The left ventricle has no regional wall motion abnormalities. Left ventricular  diastolic parameters are consistent with Grade I diastolic dysfunction (impaired relaxation).  2. Right ventricular systolic function is normal. The right ventricular size is normal.  3. Negative bubble study, no evidence for PFO/ASD.  4. The mitral valve is normal in structure. No evidence of mitral valve regurgitation. No evidence of mitral stenosis.  5. The aortic valve is tricuspid. Aortic valve regurgitation is not visualized. No aortic stenosis is present.  6. Aortic dilatation noted. There is mild dilatation of the aortic root, measuring 39 mm.  7. The inferior vena cava is normal in size with greater than 50% respiratory variability, suggesting right atrial pressure of 3 mmHg.   CT ANGIO HEAD NECK W WO CM  Result Date: 07/10/2023 CLINICAL DATA:  Transient ischemic attack (TIA) EXAM: CT ANGIOGRAPHY HEAD AND NECK WITH AND WITHOUT CONTRAST TECHNIQUE: IMPRESSION: 1. 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 2. 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 3. No hemodynamically significant stenosis in the neck. 4. Mild superficial soft stranding in the left lateral neck, possibly posttraumatic. These results will be called to the ordering clinician or representative by the Radiologist Assistant, and communication documented in the PACS or [employer]. Electronically Signed   By: Lorenza Cambridge M.D.   On: 07/10/2023 19:53   EEG adult  Result Date: 07/10/2023 Charlsie Quest, MD     07/10/2023  6:25 PM Patient Name: Matthew Mooney. MRN: [id number] Epilepsy Attending: Charlsie Quest Referring Physician/Provider: Gwenevere Abbot, MD Date: 07/10/2023  IMPRESSION: This study is within normal limits. No seizures or epileptiform discharges were seen throughout the recording. A normal interictal EEG does not exclude the diagnosis of epilepsy. Charlsie Quest   MR BRAIN WO CONTRAST  Result Date: 07/10/2023 CLINICAL DATA:  Neuro deficit, acute, stroke suspected. Weakness and numbness of  the left side. EXAM: MRI HEAD WITHOUT CONTRAST IMPRESSION: No acute or reversible finding. No cause of the presenting symptoms is identified. Few punctate foci of T2 and FLAIR signal in the cerebral hemispheric white matter, not likely significant. Electronically Signed   By: Paulina Fusi M.D.   On: 07/10/2023 09:50   CT HEAD CODE STROKE WO CONTRAST  Result Date: 07/10/2023 CLINICAL DATA:  Code stroke. Neuro deficit, acute, stroke suspected. Left-sided weakness. Dizziness. EXAM: CT HEAD WITHOUT CONTRAST CT HEAD WITHOUT CONTRAST 02/03/2022 FINDINGS: Brain: No acute infarct, hemorrhage, or mass lesion is present. No significant white matter lesions are present. The ventricles are of normal size. No significant extraaxial fluid collection is present. Deep brain nuclei are within normal limits. The brainstem and cerebellum are within normal limits. Midline structures are within normal limits. Vascular: No hyperdense vessel or unexpected calcification. Skull: Calvarium is intact. No focal lytic or blastic lesions are present. No significant extracranial soft tissue lesion is present. Sinuses/Orbits: The remote right orbital blowout fracture is noted. The globes and orbits are otherwise within normal limits. The paranasal sinuses and mastoid air cells are clear. Other: ASPECTS (Alberta Stroke Program Early CT Score) - Ganglionic level infarction (caudate, lentiform nuclei, internal capsule, insula, M1-M3 cortex): 7/7 - Supraganglionic infarction (M4-M6 cortex): 3/3 Total score (0-10 with 10 being normal): 10/10 IMPRESSION: 1. Negative CT of the head. 2. Aspects is 10/10. 3. Remote right orbital blowout fracture. The above was relayed via text pager to Dr. Wilford Corner on 07/10/2023 at 09:26 . Electronically Signed   By: Marin Roberts M.D.   On: 07/10/2023 09:27     Discharge Instructions: Discharge Instructions     Ambulatory referral to Neurology   Complete by: As directed    Follow up in stroke clinic at  [facility] with Ihor Austin, NP in about 4 weeks. If not available, consider Dr. Delia Heady, Dr. Jamelle Rushing or Dr. Naomie Dean.   Call MD for:  difficulty breathing, headache or visual disturbances   Complete by: As directed    Call MD for:  extreme fatigue   Complete by: As directed    Call MD for:  hives   Complete by: As directed    Call MD for:  persistant dizziness or light-headedness   Complete by: As directed    Call MD for:  persistant nausea and vomiting   Complete by: As directed    Call MD for:  redness, tenderness, or signs of infection (pain, swelling, redness, odor or green/yellow discharge around incision site)   Complete by: As directed    Call MD for:  severe uncontrolled pain   Complete by: As directed    Call MD for:  temperature >100.4   Complete by: As directed    Diet - low sodium heart healthy   Complete by: As directed    Increase activity slowly   Complete by: As directed        Mr. Clabo,  It was a pleasure taking care of you and we are so glad you are feeling better. You were admitted to the hospital on 07/10/2023 for extremity weakness and numbness. In the ED you were worked up for a stroke and imaging ruled out a stroke, making it likely a transient ischemic attack (TIA).   Key steps to keep you healthy!   For your TIA - Take aspirin 81mg  every day for  3 weeks - take Plavix 75mg  every day  - Continue to manage your cholesterol and blood pressure. This is very important to reduce your risk of future incidents.  - follow up with the neurology stroke clinic in 8 weeks  For your Hyperlipidemia - start atorvastatin 80mg  every night. If you experience muscle aches, follow up with your PCP. -follow up with your PCP in 7-10 days  For your Major depression and Generalized Anxiety - Continue taking your home medications as prescribed - amitriptyline 25mg  daily - prozac 20mg  daily   If you have any questions or concerns,  please call us at [phone].   If you have these following symptoms, please go to an emergency department: -chest pain -shortness of breath -Sudden weakness in one arm -Sudden weakness in face -Slurred speech -Numbness or tingling that is new  Signed: Gwenevere Abbot, MD Eligha Bridegroom. [facility] Internal Medicine Residency, PGY-2 07/11/2023, 7:36 PM

## 2023-07-11 NOTE — Progress Notes (Signed)
Discharge instructions given. Patient verbalized understanding and all questions were answered.  ?

## 2023-07-11 NOTE — Hospital Course (Addendum)
Headache 3/10, irritating Sx resolved    AORTIC DILATION FOLLOW UPS   History of Present Illness:  Pt is a 57 year old male with PMHx of hypogonadism on HRT, GAD, MDD, and OA presenting to the ED with stroke like symptoms. His symptoms included alternation in sensation and left sided weakness. Symptoms started this morning but have been intermittently going on for 2 weeks. Wife reports pt has blank stares intermittently over the last 2 weeks and has complained of fatigue. Only pertinent neurologic hx was head trauma 8 years ago where pt fell from the roof. He was not hospitalized for this.   In the ED, code stroke was called. Pt got imaging that was negative for stroke so pt was admitted for TIA work up.     Hospital Course   Transient Ischemic Attack Pt's presentation was concerning for CVA but imaging negative making it a TIA. Possible etiologies could be uncontrolled risk factors vs testosterone therapy. Testosterone therapy increases risk of DVT which can lead to CVA. No sign of DVT on exam. TTE with bubble study was negative for PFO and showed no structural or functional abnormalities of the patients heart. An incidental finding of Aortic dilation of 4cm was found on Echo. Patients A1c was WNL at 5.2. Lipid panel revealed Hyperlipidemia with LDL of 162. Pt's HCT is increased likely 2/2 to HRT which can predispose him to hypoperfusion especially if he has concomitant dehydration and intracranial stenosis (see below). HCT decreased to wnl with hydration and cessation of HRT. Seizures were considered to explain pt's symptoms especially given his symptoms in the prior 2 weeks so EEG was ordered. EEG was negative for seizure activity. Neurology was consulted and recommended following up in 8 weeks in the stroke clinic.    AKI  Pt's creatinine was elevated at 1.47 but decreased to 1.05 with IVF overnight. This was likely due to dehydration.   Chronic Problems MDD/GAD: Restart home  meds Hypogonadism: Holding HRT for now.   *Patient instructions*  Mr. Matthew Mooney,  It was a pleasure taking care of you and we are so glad you are feeling better. You were admitted to the hospital on 07/10/2023 for extremity weakness and numbness. In the ED you were worked up for a stroke and imaging ruled out a stroke, making it likely a transient ischemic attack (TIA).   Key steps to keep you healthy!   For your TIA - Take aspirin 81mg  every day for 3 weeks - take Plavix 75mg  every day  - Continue to manage your cholesterol and blood pressure. This is very important to reduce your risk of future incidents.  - follow up with the neurology stroke clinic in 8 weeks  For your Hyperlipidemia - start atorvastatin 80mg  every night. If you experience muscle aches, follow up with your PCP. -follow up with your PCP in 7-10 days  For your Major depression and Generalized Anxiety - Continue taking your home medications as prescribed - amitriptyline 25mg  daily - prozac 20mg  daily   If you have any questions or concerns, please call us at [phone].   If you have these following symptoms, please go to an emergency department: -chest pain -shortness of breath -Sudden weakness in one arm -Sudden weakness in face -Slurred speech -Numbness or tingling that is new

## 2023-07-11 NOTE — Plan of Care (Signed)
  Problem: Education: Goal: Knowledge of General Education information will improve Description: Including pain rating scale, medication(s)/side effects and non-pharmacologic comfort measures Outcome: Progressing   Problem: Clinical Measurements: Goal: Diagnostic test results will improve Outcome: Progressing   Problem: Coping: Goal: Level of anxiety will decrease Outcome: Progressing   Problem: Safety: Goal: Ability to remain free from injury will improve Outcome: Progressing   

## 2023-07-11 NOTE — Progress Notes (Addendum)
STROKE TEAM PROGRESS NOTE   BRIEF HPI Mr. Matthew Zurcher. is a 57 y.o. male with history of anxiety and depression presenting with transient left arm numbness and weakness which resolved while he was en route to the hospital.  MRI revealed no acute infarct, so patient was admitted for TIA workup.   INTERIM HISTORY/SUBJECTIVE Patient is seen in his room with no family at the bedside.  He has been hemodynamically stable and has no complaints this morning.   OBJECTIVE  CBC    Component Value Date/Time   WBC 7.3 07/11/2023 0244   WBC 7.3 07/11/2023 0244   RBC 5.32 07/11/2023 0244   RBC 5.33 07/11/2023 0244   HGB 16.3 07/11/2023 0244   HGB 16.5 07/11/2023 0244   HCT 48.6 07/11/2023 0244   HCT 49.2 07/11/2023 0244   PLT 199 07/11/2023 0244   PLT 206 07/11/2023 0244   MCV 91.4 07/11/2023 0244   MCV 92.3 07/11/2023 0244   MCH 30.6 07/11/2023 0244   MCH 31.0 07/11/2023 0244   MCHC 33.5 07/11/2023 0244   MCHC 33.5 07/11/2023 0244   RDW 12.6 07/11/2023 0244   RDW 12.5 07/11/2023 0244   LYMPHSABS 2.5 07/11/2023 0244   MONOABS 0.7 07/11/2023 0244   EOSABS 0.1 07/11/2023 0244   BASOSABS 0.1 07/11/2023 0244    BMET    Component Value Date/Time   NA 138 07/11/2023 0244   K 4.4 07/11/2023 0244   CL 101 07/11/2023 0244   CO2 29 07/11/2023 0244   GLUCOSE 105 (H) 07/11/2023 0244   BUN 19 07/11/2023 0244   CREATININE 1.05 07/11/2023 0244   CALCIUM 8.5 (L) 07/11/2023 0244   GFRNONAA >60 07/11/2023 0244    IMAGING past 24 hours ECHOCARDIOGRAM COMPLETE BUBBLE STUDY  Result Date: 07/11/2023    ECHOCARDIOGRAM REPORT   Patient Name:   Matthew Blazejewski. Date of Exam: 07/11/2023 Medical Rec #:  [id number]           Height:       66.0 in Accession #:    [id number]          Weight:       193.6 lb Date of Birth:  01-01-1966           BSA:          1.972 m Patient Age:    57 years            BP:           143/75 mmHg Patient Gender: M                   HR:           79 bpm. Exam Location:   Inpatient Procedure: 2D Echo, Cardiac Doppler, Color Doppler and Saline Contrast Bubble            Study Indications:    Stroke  History:        Patient has no prior history of Echocardiogram examinations.                 TIA.  Sonographer:    Wallie Char Referring Phys: [id number] TRAVIS J YOUNG IMPRESSIONS  1. Left ventricular ejection fraction, by estimation, is 60 to 65%. The left ventricle has normal function. The left ventricle has no regional wall motion abnormalities. Left ventricular diastolic parameters are consistent with Grade I diastolic dysfunction (impaired relaxation).  2. Right ventricular systolic function is normal. The right ventricular size is  normal.  3. Negative bubble study, no evidence for PFO/ASD.  4. The mitral valve is normal in structure. No evidence of mitral valve regurgitation. No evidence of mitral stenosis.  5. The aortic valve is tricuspid. Aortic valve regurgitation is not visualized. No aortic stenosis is present.  6. Aortic dilatation noted. There is mild dilatation of the aortic root, measuring 39 mm.  7. The inferior vena cava is normal in size with greater than 50% respiratory variability, suggesting right atrial pressure of 3 mmHg. FINDINGS  Left Ventricle: Left ventricular ejection fraction, by estimation, is 60 to 65%. The left ventricle has normal function. The left ventricle has no regional wall motion abnormalities. The left ventricular internal cavity size was normal in size. There is  no left ventricular hypertrophy. Left ventricular diastolic parameters are consistent with Grade I diastolic dysfunction (impaired relaxation). Right Ventricle: The right ventricular size is normal. No increase in right ventricular wall thickness. Right ventricular systolic function is normal. Left Atrium: Left atrial size was normal in size. Right Atrium: Right atrial size was normal in size. Pericardium: There is no evidence of pericardial effusion. Mitral Valve: The mitral valve is  normal in structure. Mild mitral annular calcification. No evidence of mitral valve regurgitation. No evidence of mitral valve stenosis. MV peak gradient, 3.0 mmHg. The mean mitral valve gradient is 1.0 mmHg. Tricuspid Valve: The tricuspid valve is normal in structure. Tricuspid valve regurgitation is not demonstrated. Aortic Valve: The aortic valve is tricuspid. Aortic valve regurgitation is not visualized. Aortic regurgitation PHT measures 666 msec. No aortic stenosis is present. Aortic valve mean gradient measures 9.0 mmHg. Aortic valve peak gradient measures 17.1 mmHg. Aortic valve area, by VTI measures 2.27 cm. Pulmonic Valve: The pulmonic valve was normal in structure. Pulmonic valve regurgitation is not visualized. Aorta: The aortic root is normal in size and structure and aortic dilatation noted. There is mild dilatation of the aortic root, measuring 39 mm. Venous: The inferior vena cava is normal in size with greater than 50% respiratory variability, suggesting right atrial pressure of 3 mmHg. IAS/Shunts: Negative bubble study, no evidence for PFO/ASD. Agitated saline contrast was given intravenously to evaluate for intracardiac shunting.  LEFT VENTRICLE PLAX 2D LVIDd:         5.20 cm     Diastology LVIDs:         3.70 cm     LV e' medial:    7.35 cm/s LV PW:         1.00 cm     LV E/e' medial:  9.6 LV IVS:        0.80 cm     LV e' lateral:   9.75 cm/s LVOT diam:     1.80 cm     LV E/e' lateral: 7.2 LV SV:         80 LV SV Index:   41 LVOT Area:     2.54 cm  LV Volumes (MOD) LV vol d, MOD A2C: 66.5 ml LV vol d, MOD A4C: 83.0 ml LV vol s, MOD A2C: 28.0 ml LV vol s, MOD A4C: 36.0 ml LV SV MOD A2C:     38.5 ml LV SV MOD A4C:     83.0 ml LV SV MOD BP:      46.4 ml RIGHT VENTRICLE             IVC RV Basal diam:  3.80 cm     IVC diam: 1.40 cm RV S prime:  14.60 cm/s TAPSE (M-mode): 2.0 cm LEFT ATRIUM             Index        RIGHT ATRIUM           Index LA diam:        3.60 cm 1.83 cm/m   RA Area:     10.60  cm LA Vol (A2C):   41.3 ml 20.94 ml/m  RA Volume:   20.50 ml  10.39 ml/m LA Vol (A4C):   38.7 ml 19.62 ml/m LA Biplane Vol: 40.8 ml 20.69 ml/m  AORTIC VALVE AV Area (Vmax):    2.18 cm AV Area (Vmean):   2.23 cm AV Area (VTI):     2.27 cm AV Vmax:           207.00 cm/s AV Vmean:          136.000 cm/s AV VTI:            0.354 m AV Peak Grad:      17.1 mmHg AV Mean Grad:      9.0 mmHg LVOT Vmax:         177.00 cm/s LVOT Vmean:        119.000 cm/s LVOT VTI:          0.316 m LVOT/AV VTI ratio: 0.89 AI PHT:            666 msec  AORTA Ao Root diam: 3.40 cm Ao Asc diam:  3.90 cm MITRAL VALVE MV Area (PHT): 3.76 cm    SHUNTS MV Area VTI:   4.10 cm    Systemic VTI:  0.32 m MV Peak grad:  3.0 mmHg    Systemic Diam: 1.80 cm MV Mean grad:  1.0 mmHg MV Vmax:       0.87 m/s MV Vmean:      46.4 cm/s MV Decel Time: 202 msec MV E velocity: 70.50 cm/s MV A velocity: 73.60 cm/s MV E/A ratio:  0.96 Dalton McleanMD Electronically signed by Wilfred Lacy Signature Date/Time: 07/11/2023/9:48:56 AM    Final    CT ANGIO HEAD NECK W WO CM  Result Date: 07/10/2023 CLINICAL DATA:  Transient ischemic attack (TIA) EXAM: CT ANGIOGRAPHY HEAD AND NECK WITH AND WITHOUT CONTRAST TECHNIQUE: Multidetector CT imaging of the head and neck was performed using the standard protocol during bolus administration of intravenous contrast. Multiplanar CT image reconstructions and MIPs were obtained to evaluate the vascular anatomy. Carotid stenosis measurements (when applicable) are obtained utilizing NASCET criteria, using the distal internal carotid diameter as the denominator. RADIATION DOSE REDUCTION: This exam was performed according to the departmental dose-optimization program which includes automated exposure control, adjustment of the mA and/or kV according to patient size and/or use of iterative reconstruction technique. CONTRAST:  60mL OMNIPAQUE IOHEXOL 350 MG/ML SOLN COMPARISON:  Same-day CT brain and brain MRI FINDINGS: CT HEAD  FINDINGS See same day CT head for intracranial findings. CTA NECK FINDINGS Aortic arch: Standard branching. Imaged portion shows no evidence of aneurysm or dissection. No significant stenosis of the major arch vessel origins. Right carotid system: No evidence of dissection, stenosis (50% or greater), or occlusion. Left carotid system: No evidence of dissection, stenosis (50% or greater), or occlusion. Vertebral arteries: Codominant. No evidence of dissection, stenosis (50% or greater), or occlusion. Skeleton: Negative. Other neck: Mild superficial soft stranding in the left lateral neck, possibly posttraumatic. Upper chest: Negative. Review of the MIP images confirms the above findings CTA HEAD FINDINGS Anterior circulation: No  significant stenosis, proximal occlusion, aneurysm, or vascular malformation. Posterior circulation: Severe focal stenosis at P1 P2 junction on the left (series 8, image 134). The right PCA is occluded at the P2 segment (series 8, image 128). No proximal occlusion, aneurysm, or vascular malformation. Venous sinuses: As permitted by contrast timing, patent. Anatomic variants: None Review of the MIP images confirms the above findings IMPRESSION: 1. 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 2. 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 3. No hemodynamically significant stenosis in the neck. 4. Mild superficial soft stranding in the left lateral neck, possibly posttraumatic. These results will be called to the ordering clinician or representative by the Radiologist Assistant, and communication documented in the PACS or [employer]. Electronically Signed   By: Lorenza Cambridge M.D.   On: 07/10/2023 19:53   EEG adult  Result Date: 07/10/2023 Charlsie Quest, MD     07/10/2023  6:25 PM Patient Name: Matthew Sima. MRN: [id number] Epilepsy Attending: Charlsie Quest Referring Physician/Provider: Gwenevere Abbot, MD Date: 07/10/2023 Duration: 23.36 mins Patient history:  57 year old with history of anxiety depression presenting to the emergency department for sudden onset of left-sided weakness and numbness along with dizziness. EEG to evaluate for seizure Level of alertness: Awake, asleep AEDs during EEG study: None Technical aspects: This EEG study was done with scalp electrodes positioned according to the 10-20 International system of electrode placement. Electrical activity was reviewed with band pass filter of 1-70Hz , sensitivity of 7 uV/mm, display speed of 46mm/sec with a 60Hz  notched filter applied as appropriate. EEG data were recorded continuously and digitally stored.  Video monitoring was available and reviewed as appropriate. Description: The posterior dominant rhythm consists of 8-9 Hz activity of moderate voltage (25-35 uV) seen predominantly in posterior head regions, symmetric and reactive to eye opening and eye closing. Sleep was characterized by vertex waves, sleep spindles (12 to 14 Hz), maximal frontocentral region. Hyperventilation and photic stimulation were not performed.   EKG artifact was sen during this study. IMPRESSION: This study is within normal limits. No seizures or epileptiform discharges were seen throughout the recording. A normal interictal EEG does not exclude the diagnosis of epilepsy. Charlsie Quest    Vitals:   07/10/23 1938 07/10/23 2312 07/11/23 0307 07/11/23 0807  BP: (!) 158/71 (!) 159/78 133/75 (!) 143/75  Pulse: 95 77 91 79  Resp: 16 18 18 18   Temp: 98 F (36.7 C) 98.8 F (37.1 C) 98.1 F (36.7 C) 97.8 F (36.6 C)  TempSrc: Oral Oral Oral Oral  SpO2: 95% 95% 97% 100%  Weight:      Height:         PHYSICAL EXAM General:  Alert, well-nourished, well-developed patient in no acute distress Psych:  Mood and affect appropriate for situation CV: Regular rate and rhythm on monitor Respiratory:  Regular, unlabored respirations on room air GI: Abdomen soft and nontender   NEURO:  Mental Status: AA&Ox3, patient is  able to give clear and coherent history Speech/Language: speech is without dysarthria or aphasia.  Naming, repetition, fluency, and comprehension intact.  Cranial Nerves:  II: PERRL. Visual fields full.  III, IV, VI: EOMI. Eyelids elevate symmetrically.  V: Sensation is intact to light touch and symmetrical to face.  VII: Face is symmetrical resting and smiling VIII: hearing intact to voice. IX, X: Palate elevates symmetrically. Phonation is normal.  NW:GNFAOZHY shrug 5/5. XII: tongue is midline without fasciculations. Motor: 5/5 strength to all muscle  groups tested.  Tone: is normal and bulk is normal Sensation- Intact to light touch bilaterally. Extinction absent to light touch to DSS.   Coordination: FTN intact bilaterally, HKS: no ataxia in BLE.No drift.  Gait- deferred  NIHSS: 0  ASSESSMENT/PLAN  TIA:  right sided TIA Code Stroke CT head No acute abnormality.  Remote right orbital blowout fracture CTA head & neck lesion of the right PCA P2 segment, severe focal stenosis at P1 P2 junction on the left MRI no acute abnormality 2D Echo EF 60 to 65%, grade 1 diastolic dysfunction, negative bubble study, normal left atrial size LDL 162 HgbA1c 5.2 VTE prophylaxis -Lovenox aspirin 81 mg daily prior to admission, now on aspirin 81 mg daily and clopidogrel 75 mg daily for 3 weeks and then Plavix alone. Therapy recommendations: Pending Disposition: Home   Hypertension Home meds: None Stable Maintain normotension  Hyperlipidemia Home meds: None LDL 162, goal < 70 Add atorvastatin 80 mg daily Continue statin at discharge   Other Stroke Risk Factors Obesity, Body mass index is 31.24 kg/m., BMI >/= 30 associated with increased stroke risk, recommend weight loss, diet and exercise as appropriate    Other Active Problems None  Hospital day # 0 Cortney E Ernestina Columbia , MSN, AGACNP-BC [facility] See Amion for schedule and pager information 07/11/2023 10:45 AM    ATTENDING ATTESTATION:  57 year old with transient left arm numbness and weakness have resolved en route to the hospital.  Currently his exam is unremarkable.  He has no complaints.  His MRI is negative.  He likely suffered a TIA.  EEG is unremarkable.  DAPT therapy for 3 weeks then Plavix after that since he was on aspirin previously.  Follow-up in stroke clinic in 8 weeks.  Goal LDL less than 70. Discussed diet and exercise for preventing stroke.  Neurology will sign off please call with questions  Dr. Viviann Spare evaluated pt independently, reviewed imaging, chart, labs. Discussed and formulated plan with the Resident/APP. Changes were made to the note where appropriate. Please see APP/resident note above for details.     Yoana Staib,MD    To contact Stroke Continuity provider, please refer to [email or web address]. After hours, contact General Neurology

## 2023-07-11 NOTE — TOC Transition Note (Signed)
Transition of Care [facility]) - CM/SW Discharge Note   Patient Details  Name: Matthew Mooney. MRN: [id number] Date of Birth: 1966/02/22  [facility] CM/SW Contact:  Kermit Balo, RN Phone Number: 07/11/2023, 3:19 PM   Clinical Narrative:    Pt is discharging home with self care. No needs per TOC.   Final next level of care: Home/Self Care Barriers to Discharge: No Barriers Identified   Patient Goals and CMS Choice      Discharge Placement                         Discharge Plan and Services Additional resources added to the After Visit Summary for                                       Social Determinants of Health (SDOH) Interventions SDOH Screenings   Food Insecurity: No Food Insecurity (07/10/2023)  Housing: Low Risk  (07/10/2023)  Transportation Needs: No Transportation Needs (07/10/2023)  Utilities: Not At Risk (07/10/2023)  Tobacco Use: Low Risk  (07/10/2023)     Readmission Risk Interventions     No data to display

## 2023-07-11 NOTE — Discharge Instructions (Signed)
*  Patient instructions*  Mr. Matthew Mooney,  It was a pleasure taking care of you and we are so glad you are feeling better. You were admitted to the hospital on 07/10/2023 for extremity weakness and numbness. In the ED you were worked up for a stroke and imaging ruled out a stroke, making it likely a transient ischemic attack (TIA).   Key steps to keep you healthy!   For your TIA - Take aspirin 81mg  every day for 3 weeks - take Plavix 75mg  every day  - Continue to manage your cholesterol and blood pressure. This is very important to reduce your risk of future incidents.  - follow up with the neurology stroke clinic in 8 weeks  For your Hyperlipidemia - start atorvastatin 80mg  every night. If you experience muscle aches, follow up with your PCP. -follow up with your PCP in 7-10 days  For your Major depression and Generalized Anxiety - Continue taking your home medications as prescribed - amitriptyline 25mg  daily - prozac 20mg  daily   If you have any questions or concerns, please call us at [phone].   If you have these following symptoms, please go to an emergency department: -chest pain -shortness of breath -Sudden weakness in one arm -Sudden weakness in face -Slurred speech -Numbness or tingling that is new

## 2023-07-11 NOTE — Plan of Care (Signed)
°  Problem: Clinical Measurements: °Goal: Diagnostic test results will improve °Outcome: Progressing °  °Problem: Activity: °Goal: Risk for activity intolerance will decrease °Outcome: Progressing °  °Problem: Nutrition: °Goal: Adequate nutrition will be maintained °Outcome: Progressing °  °Problem: Coping: °Goal: Level of anxiety will decrease °Outcome: Progressing °  °Problem: Pain Managment: °Goal: General experience of comfort will improve °Outcome: Progressing °  °

## 2023-07-22 ENCOUNTER — Other Ambulatory Visit: Payer: Self-pay

## 2023-07-22 ENCOUNTER — Other Ambulatory Visit (HOSPITAL_COMMUNITY): Payer: Self-pay

## 2023-07-29 ENCOUNTER — Other Ambulatory Visit (HOSPITAL_COMMUNITY): Payer: Self-pay

## 2023-07-29 DIAGNOSIS — G459 Transient cerebral ischemic attack, unspecified: Secondary | ICD-10-CM | POA: Diagnosis not present

## 2023-07-29 DIAGNOSIS — E782 Mixed hyperlipidemia: Secondary | ICD-10-CM | POA: Diagnosis not present

## 2023-07-29 DIAGNOSIS — M130 Polyarthritis, unspecified: Secondary | ICD-10-CM | POA: Diagnosis not present

## 2023-07-29 DIAGNOSIS — Z6830 Body mass index (BMI) 30.0-30.9, adult: Secondary | ICD-10-CM | POA: Diagnosis not present

## 2023-07-29 MED ORDER — TRAMADOL HCL 50 MG PO TABS
50.0000 mg | ORAL_TABLET | Freq: Two times a day (BID) | ORAL | 5 refills | Status: DC | PRN
  Filled 2023-07-29 – 2023-07-30 (×2): qty 60, 30d supply, fill #0

## 2023-07-29 MED ORDER — ATORVASTATIN CALCIUM 80 MG PO TABS
80.0000 mg | ORAL_TABLET | Freq: Every day | ORAL | 3 refills | Status: DC
Start: 2023-07-29 — End: 2024-07-27
  Filled 2023-07-29 – 2023-11-05 (×3): qty 90, 90d supply, fill #0
  Filled 2024-01-27: qty 90, 90d supply, fill #1
  Filled 2024-04-14: qty 90, 90d supply, fill #2

## 2023-07-29 MED ORDER — CLOPIDOGREL BISULFATE 75 MG PO TABS
75.0000 mg | ORAL_TABLET | Freq: Every day | ORAL | 4 refills | Status: DC
Start: 2023-07-29 — End: 2024-10-19
  Filled 2023-07-30 – 2023-11-05 (×2): qty 90, 90d supply, fill #0
  Filled 2024-01-27: qty 90, 90d supply, fill #1
  Filled 2024-04-14: qty 90, 90d supply, fill #2
  Filled 2024-07-27: qty 90, 90d supply, fill #3

## 2023-07-30 ENCOUNTER — Other Ambulatory Visit (HOSPITAL_COMMUNITY): Payer: Self-pay

## 2023-08-27 ENCOUNTER — Other Ambulatory Visit (HOSPITAL_COMMUNITY): Payer: Self-pay

## 2023-08-28 ENCOUNTER — Other Ambulatory Visit (HOSPITAL_COMMUNITY): Payer: Self-pay

## 2023-08-28 MED ORDER — AMITRIPTYLINE HCL 25 MG PO TABS
25.0000 mg | ORAL_TABLET | Freq: Every day | ORAL | 3 refills | Status: DC
  Filled 2023-08-28 – 2023-11-25 (×2): qty 90, 90d supply, fill #0
  Filled 2024-02-17: qty 90, 90d supply, fill #1
  Filled 2024-05-21: qty 90, 90d supply, fill #2

## 2023-09-02 ENCOUNTER — Other Ambulatory Visit (HOSPITAL_COMMUNITY): Payer: Self-pay

## 2023-09-02 ENCOUNTER — Other Ambulatory Visit: Payer: Self-pay

## 2023-09-02 DIAGNOSIS — L821 Other seborrheic keratosis: Secondary | ICD-10-CM | POA: Diagnosis not present

## 2023-09-02 DIAGNOSIS — D1801 Hemangioma of skin and subcutaneous tissue: Secondary | ICD-10-CM | POA: Diagnosis not present

## 2023-09-02 DIAGNOSIS — D171 Benign lipomatous neoplasm of skin and subcutaneous tissue of trunk: Secondary | ICD-10-CM | POA: Diagnosis not present

## 2023-09-02 DIAGNOSIS — L570 Actinic keratosis: Secondary | ICD-10-CM | POA: Diagnosis not present

## 2023-09-02 DIAGNOSIS — L814 Other melanin hyperpigmentation: Secondary | ICD-10-CM | POA: Diagnosis not present

## 2023-09-02 MED ORDER — TRAMADOL HCL 50 MG PO TABS
50.0000 mg | ORAL_TABLET | Freq: Two times a day (BID) | ORAL | 2 refills | Status: DC | PRN
Start: 2023-09-02 — End: 2024-03-17
  Filled 2023-09-02 – 2023-12-05 (×3): qty 180, 90d supply, fill #0

## 2023-09-07 ENCOUNTER — Other Ambulatory Visit (HOSPITAL_COMMUNITY): Payer: Self-pay

## 2023-09-16 ENCOUNTER — Ambulatory Visit: Payer: 59 | Admitting: Diagnostic Neuroimaging

## 2023-09-16 ENCOUNTER — Encounter: Payer: Self-pay | Admitting: Diagnostic Neuroimaging

## 2023-09-16 ENCOUNTER — Other Ambulatory Visit: Payer: Self-pay

## 2023-09-16 ENCOUNTER — Other Ambulatory Visit (HOSPITAL_COMMUNITY): Payer: Self-pay

## 2023-09-16 VITALS — BP 153/85 | HR 87 | Ht 66.0 in | Wt 196.2 lb

## 2023-09-16 DIAGNOSIS — R0683 Snoring: Secondary | ICD-10-CM

## 2023-09-16 DIAGNOSIS — G459 Transient cerebral ischemic attack, unspecified: Secondary | ICD-10-CM | POA: Diagnosis not present

## 2023-09-16 DIAGNOSIS — R400 Somnolence: Secondary | ICD-10-CM | POA: Diagnosis not present

## 2023-09-16 MED ORDER — TESTOSTERONE 1.62 % TD GEL
4.0000 | Freq: Every day | TRANSDERMAL | 2 refills | Status: AC
Start: 2023-09-16 — End: ?
  Filled 2023-09-16: qty 150, 30d supply, fill #0
  Filled 2023-09-18: qty 450, 90d supply, fill #0

## 2023-09-16 NOTE — Progress Notes (Signed)
[facility]  PATIENT: Matthew Mooney. DOB: 12/01/1966  REFERRING CLINICIAN: de Saintclair Halsted, Cortney E,* HISTORY FROM: patient and wife REASON FOR VISIT: new consult   HISTORICAL  CHIEF COMPLAINT:  Chief Complaint  Patient presents with   New Patient (Initial Visit)    Patient in room #7 with his wife. Patient states he is fine and patient wife state he has been very forgetfully a lot.    HISTORY OF PRESENT ILLNESS:   57 year old male here for evaluation of TIA.  History of depression anxiety disorder, testosterone replacement therapy, osteoarthritis, presents to the hospital on 07/10/2023 due to onset of left face, arm, leg numbness and left arm and leg weakness.  He was having some cognitive difficulty as well.  Symptoms lasted for several hours and then resolved.  He went to the hospital for evaluation.  Stroke workup was completed.  He was diagnosed with TIA.  He was managed medically.  Since discharge he has had a few days of recurrent symptoms lasting for a few minutes which have resolved.  Wife notes increasing flat affect since this happened.  Also has history of poor quality sleep, excessive daytime sleepiness, snoring.  Tolerating medications.   REVIEW OF SYSTEMS: Full 14 system review of systems performed and negative with exception of: as per HPI.  ALLERGIES: Allergies  Allergen Reactions   Adhesive [Tape] Other (See Comments)    Patient states "it ripped my skin when it came off"    HOME MEDICATIONS: Outpatient Medications Prior to Visit  Medication Sig Dispense Refill   acetaminophen (TYLENOL) 325 MG tablet Take 2 tablets (650 mg total) by mouth every 6 (six) hours as needed for mild pain (or Fever >/= 101).     amitriptyline (ELAVIL) 25 MG tablet Take 1 tablet (25 mg total) by mouth at bedtime for sleep. 90 tablet 3   atorvastatin (LIPITOR) 80 MG tablet Take 1 tablet (80 mg) by mouth daily. 90 tablet 3   cholecalciferol (VITAMIN D3) 25 MCG  (1000 UNIT) tablet Take 1,000 Units by mouth daily.     clopidogrel (PLAVIX) 75 MG tablet Take 1 tablet (75 mg) by mouth daily. 90 tablet 4   FLUoxetine (PROZAC) 20 MG capsule Take 1 capsule (20 mg total) by mouth daily. (Patient taking differently: Take 20 mg by mouth at bedtime.) 90 capsule 3   MAGNESIUM PO Take 1 tablet by mouth daily.     Multiple Vitamin (MULTIVITAMIN ADULT PO) Take 1 tablet by mouth daily.     Omega-3 Fatty Acids (FISH OIL OMEGA-3 PO) Take 1 capsule by mouth daily.     Testosterone 20.25 MG/ACT (1.62%) GEL Apply 4 pumps on skin daily (Patient taking differently: Apply 4 Pump topically daily.) 450 g 2   tiZANidine (ZANAFLEX) 4 MG tablet Take 4 mg by mouth every 8 (eight) hours as needed for muscle spasms.     traMADol (ULTRAM) 50 MG tablet Take 1 tablet (50 mg) by mouth 2 times daily as needed. 60 tablet 5   traMADol (ULTRAM) 50 MG tablet Take 1 tablet (50 mg total) by mouth 2 (two) times daily as needed. 180 tablet 2   amitriptyline (ELAVIL) 25 MG tablet Take 1 (one) Tablet at bedtime for SLEEP (Patient taking differently: Take 25 mg by mouth at bedtime.) 90 tablet 3   traMADol (ULTRAM) 50 MG tablet Take 50 mg by mouth every 12 (twelve) hours as needed for severe pain.     clopidogrel (PLAVIX) 75 MG tablet  Take 1 tablet (75 mg total) by mouth daily. 30 tablet 2   No facility-administered medications prior to visit.    PAST MEDICAL HISTORY: Past Medical History:  Diagnosis Date   Anxiety    Arthritis    Depression    History of kidney stones     PAST SURGICAL HISTORY: Past Surgical History:  Procedure Laterality Date   EXTRACORPOREAL SHOCK WAVE LITHOTRIPSY Right 08/28/2021   Procedure: EXTRACORPOREAL SHOCK WAVE LITHOTRIPSY (ESWL);  Surgeon: Belva Agee, MD;  Location: [facility];  Service: Urology;  Laterality: Right;   SHOULDER ARTHROSCOPY W/ ROTATOR CUFF REPAIR Left 2019   SHOULDER ARTHROSCOPY WITH ROTATOR CUFF REPAIR AND SUBACROMIAL  DECOMPRESSION Right 06/13/2020   Procedure: ARTHROSCOPY SHOULDER ROTATOR CUFF REPAIR, DEBRIDEMENT, SUBACROMIAL DECOMPRESSION;  Surgeon: Jones Broom, MD;  Location: [facility];  Service: Orthopedics;  Laterality: Right;   TONSILLECTOMY      FAMILY HISTORY: History reviewed. No pertinent family history.  SOCIAL HISTORY: Social History   Socioeconomic History   Marital status: Married    Spouse name: Not on file   Number of children: Not on file   Years of education: Not on file   Highest education level: Not on file  Occupational History   Not on file  Tobacco Use   Smoking status: Never   Smokeless tobacco: Never  Substance and Sexual Activity   Alcohol use: No   Drug use: No   Sexual activity: Yes  Other Topics Concern   Not on file  Social History Narrative   Not on file   Social Determinants of Health   Financial Resource Strain: Not on file  Food Insecurity: No Food Insecurity (07/10/2023)   Hunger Vital Sign    Worried About Running Out of Food in the Last Year: Never true    Ran Out of Food in the Last Year: Never true  Transportation Needs: No Transportation Needs (07/10/2023)   PRAPARE - Administrator, Civil Service (Medical): No    Lack of Transportation (Non-Medical): No  Physical Activity: Not on file  Stress: Not on file  Social Connections: Not on file  Intimate Partner Violence: Not At Risk (07/10/2023)   Humiliation, Afraid, Rape, and Kick questionnaire    Fear of Current or Ex-Partner: No    Emotionally Abused: No    Physically Abused: No    Sexually Abused: No     PHYSICAL EXAM  GENERAL EXAM/CONSTITUTIONAL: Vitals:  Vitals:   09/16/23 0951  BP: (!) 153/85  Pulse: 87  Weight: 196 lb 3.2 oz (89 kg)  Height: 5\' 6"  (1.676 m)   Body mass index is 31.67 kg/m. Wt Readings from Last 3 Encounters:  09/16/23 196 lb 3.2 oz (89 kg)  07/10/23 193 lb 9 oz (87.8 kg)  02/03/22 200 lb (90.7 kg)   Patient is in no  distress; well developed, nourished and groomed; neck is supple  CARDIOVASCULAR: Examination of carotid arteries is normal; no carotid bruits Regular rate and rhythm, no murmurs Examination of peripheral vascular system by observation and palpation is normal  EYES: Ophthalmoscopic exam of optic discs and posterior segments is normal; no papilledema or hemorrhages No results found.  MUSCULOSKELETAL: Gait, strength, tone, movements noted in Neurologic exam below  NEUROLOGIC: MENTAL STATUS:     09/16/2023    9:57 AM  MMSE - Mini Mental State Exam  Orientation to time 5  Orientation to Place 5  Registration 3  Attention/ Calculation 5  Recall  3  Language- name 2 objects 2  Language- repeat 1  Language- follow 3 step command 3  Language- read & follow direction 1  Write a sentence 1  Copy design 1  Total score 30   awake, alert, oriented to person, place and time recent and remote memory intact normal attention and concentration language fluent, comprehension intact, naming intact fund of knowledge appropriate  CRANIAL NERVE:  2nd - no papilledema on fundoscopic exam 2nd, 3rd, 4th, 6th - pupils equal and reactive to light, visual fields full to confrontation, extraocular muscles intact, no nystagmus 5th - facial sensation symmetric 7th - facial strength symmetric 8th - hearing intact 9th - palate elevates symmetrically, uvula midline 11th - shoulder shrug symmetric 12th - tongue protrusion midline  MOTOR:  normal bulk and tone, full strength in the BUE, BLE  SENSORY:  normal and symmetric to light touch, temperature, vibration  COORDINATION:  finger-nose-finger, fine finger movements normal  REFLEXES:  deep tendon reflexes present and symmetric  GAIT/STATION:  narrow based gait; romberg is negative     DIAGNOSTIC DATA (LABS, IMAGING, TESTING) - I reviewed patient records, labs, notes, testing and imaging myself where available.  Lab Results  Component  Value Date   WBC 7.3 07/11/2023   WBC 7.3 07/11/2023   HGB 16.3 07/11/2023   HGB 16.5 07/11/2023   HCT 48.6 07/11/2023   HCT 49.2 07/11/2023   MCV 91.4 07/11/2023   MCV 92.3 07/11/2023   PLT 199 07/11/2023   PLT 206 07/11/2023      Component Value Date/Time   NA 138 07/11/2023 0244   K 4.4 07/11/2023 0244   CL 101 07/11/2023 0244   CO2 29 07/11/2023 0244   GLUCOSE 105 (H) 07/11/2023 0244   BUN 19 07/11/2023 0244   CREATININE 1.05 07/11/2023 0244   CALCIUM 8.5 (L) 07/11/2023 0244   PROT 6.7 07/10/2023 0911   ALBUMIN 4.2 07/10/2023 0911   AST 25 07/10/2023 0911   ALT 42 07/10/2023 0911   ALKPHOS 63 07/10/2023 0911   BILITOT 1.0 07/10/2023 0911   GFRNONAA >60 07/11/2023 0244   Lab Results  Component Value Date   CHOL 232 (H) 07/10/2023   HDL 44 07/10/2023   LDLCALC 162 (H) 07/10/2023   TRIG 129 07/10/2023   CHOLHDL 5.3 07/10/2023   Lab Results  Component Value Date   HGBA1C 5.2 07/10/2023   No results found for: "VITAMINB12" Lab Results  Component Value Date   TSH 1.509 07/10/2023    07/10/23 MRI brain - No acute or reversible finding. No cause of the presenting symptoms is identified. Few punctate foci of T2 and FLAIR signal in the cerebral hemispheric white matter, not likely significant.  07/10/23 CTA head / neck 1. 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 2. 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 3. No hemodynamically significant stenosis in the neck. 4. Mild superficial soft stranding in the left lateral neck, possibly posttraumatic.  07/11/23 TTE  1. Left ventricular ejection fraction, by estimation, is 60 to 65%. The  left ventricle has normal function. The left ventricle has no regional  wall motion abnormalities. Left ventricular diastolic parameters are  consistent with Grade I diastolic  dysfunction (impaired relaxation).   2. Right ventricular systolic function is normal. The right ventricular  size is normal.   3.  Negative bubble study, no evidence for PFO/ASD.   4. The mitral valve is normal in structure. No evidence of mitral valve  regurgitation. No evidence of mitral stenosis.   5. The aortic valve is tricuspid. Aortic valve regurgitation is not  visualized. No aortic stenosis is present.   6. Aortic dilatation noted. There is mild dilatation of the aortic root,  measuring 39 mm.   7. The inferior vena cava is normal in size with greater than 50%  respiratory variability, suggesting right atrial pressure of 3 mmHg.    ASSESSMENT AND PLAN  57 y.o. year old male here with:   Dx:  1. TIA (transient ischemic attack)   2. Snoring   3. Daytime sleepiness     PLAN:  TRANSIENT LEFT FACE, ARM, LEG numbness (plus left arm, leg weakness; plus cognitive slowing; lasting ~3 hours) - continue clopidogrel 75mg , atorvastatin 80mg  - monitor BP at home - check sleep study - optimize nutrition and exercise  Orders Placed This Encounter  Procedures   Ambulatory referral to Sleep Studies   Return for pending if symptoms worsen or fail to improve, return to PCP.  I spent 60 minutes of face-to-face and non-face-to-face time with patient.  This included previsit chart review, lab review, study review, order entry, electronic health record documentation, patient education.     Suanne Marker, MD 09/16/2023, 10:27 AM Certified in Neurology, Neurophysiology and Neuroimaging  [facility] [address] [place], [place] [address] [phone]

## 2023-09-16 NOTE — Patient Instructions (Signed)
  TRANSIENT LEFT FACE, ARM, LEG numbness (plus left arm, leg weakness; plus cognitive slowing; lasting ~3 hours) - continue clopidogrel 75mg , atorvastatin 80mg  - monitor BP at home - check sleep study - optimize nutrition and exercise

## 2023-09-18 ENCOUNTER — Other Ambulatory Visit (HOSPITAL_COMMUNITY): Payer: Self-pay

## 2023-09-18 ENCOUNTER — Other Ambulatory Visit: Payer: Self-pay

## 2023-09-19 ENCOUNTER — Other Ambulatory Visit (HOSPITAL_COMMUNITY): Payer: Self-pay

## 2023-10-07 ENCOUNTER — Other Ambulatory Visit (HOSPITAL_COMMUNITY): Payer: Self-pay

## 2023-10-07 MED ORDER — CELECOXIB 200 MG PO CAPS
200.0000 mg | ORAL_CAPSULE | Freq: Two times a day (BID) | ORAL | 1 refills | Status: DC
  Filled 2023-10-07 – 2024-01-06 (×2): qty 180, 90d supply, fill #0

## 2023-10-14 DIAGNOSIS — H3552 Pigmentary retinal dystrophy: Secondary | ICD-10-CM | POA: Diagnosis not present

## 2023-10-14 DIAGNOSIS — H524 Presbyopia: Secondary | ICD-10-CM | POA: Diagnosis not present

## 2023-10-14 DIAGNOSIS — H25813 Combined forms of age-related cataract, bilateral: Secondary | ICD-10-CM | POA: Diagnosis not present

## 2023-10-21 ENCOUNTER — Other Ambulatory Visit (HOSPITAL_COMMUNITY): Payer: Self-pay

## 2023-10-21 ENCOUNTER — Encounter: Payer: Self-pay | Admitting: Neurology

## 2023-10-21 ENCOUNTER — Ambulatory Visit: Payer: 59 | Admitting: Neurology

## 2023-10-21 VITALS — BP 144/88 | HR 95 | Ht 66.0 in | Wt 197.4 lb

## 2023-10-21 DIAGNOSIS — E66811 Obesity, class 1: Secondary | ICD-10-CM

## 2023-10-21 DIAGNOSIS — G479 Sleep disorder, unspecified: Secondary | ICD-10-CM | POA: Diagnosis not present

## 2023-10-21 DIAGNOSIS — Z820 Family history of epilepsy and other diseases of the nervous system: Secondary | ICD-10-CM | POA: Diagnosis not present

## 2023-10-21 DIAGNOSIS — R0683 Snoring: Secondary | ICD-10-CM | POA: Diagnosis not present

## 2023-10-21 DIAGNOSIS — G459 Transient cerebral ischemic attack, unspecified: Secondary | ICD-10-CM | POA: Diagnosis not present

## 2023-10-21 DIAGNOSIS — Z9189 Other specified personal risk factors, not elsewhere classified: Secondary | ICD-10-CM

## 2023-10-21 DIAGNOSIS — R519 Headache, unspecified: Secondary | ICD-10-CM | POA: Diagnosis not present

## 2023-10-21 DIAGNOSIS — R351 Nocturia: Secondary | ICD-10-CM

## 2023-10-21 NOTE — Patient Instructions (Signed)

## 2023-10-21 NOTE — Progress Notes (Addendum)
Subjective:    Patient ID: Matthew Mooney. is a 57 y.o. male.  HPI    Matthew Foley, MD, PhD [facility] [address] [address] [place], [place] [address]  Dear Matthew Mooney,  I saw your patient, Matthew Mooney, upon your kind request in my sleep clinic today for initial consultation of his sleep disorder, in particular, concern for underlying obstructive sleep apnea.  The patient is accompanied by his wife today.  As you know, [name] is a 57 year old male with an underlying medical history of hyperlipidemia, arthritis with status post bilateral rotator cuff surgeries, anxiety, depression, kidney stones with status post ESWL, history of TIA in July 2024, and mild obesity, who reports snoring and excessive daytime somnolence, and nonrestorative sleep.  His Epworth sleepiness score is 14 out of 24, fatigue severity score is 40 out of 63.  He reports feeling tired during the day, he has not been a good sleeper for years.  He has trouble staying asleep.  He has been on amitriptyline at night for sleep for a couple of years.  He twitches and also kicks in his sleep per wife, he is also on Prozac.  He endorses nocturia about once or twice per average night, he takes testosterone for low T.  He has occasional morning headaches which are dull and achy, sometimes he takes over-the-counter medication for these but also tramadol as needed.  He has a prescription for tramadol for chronic pain, he is sustained injuries in the past from the fall.  He reports that his dad has sleep apnea and his daughter has sleep apnea as well.  He had a tonsillectomy.  He drinks caffeine in the form of tea, 5-6 servings per day.  He drinks alcohol rarely, if any.  He is a non-smoker.  He takes hormones.  I reviewed your office note from 09/16/2023.  He is working on weight loss, he recently has lost some weight, generally has fluctuated up or down by about 15 pounds for years.  Bedtime is generally  around 10 PM, wife reports that he is a restless and light sleeper.  Rise time is 6:45 AM.  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  He lives with his wife.  They have 5 dogs in the household, they typically sleep in the bedroom with them, 3 of them often in the bed.  He has a TV in the bedroom, they watch it until bedtime and try to switch it up around 10 PM.  His Past Medical History Is Significant For: Past Medical History:  Diagnosis Date   Anxiety    Arthritis    Concussion    due to falling off roof/  mulltiple fracture   Depression    History of kidney stones    TIA (transient ischemic attack)     His Past Surgical History Is Significant For: Past Surgical History:  Procedure Laterality Date   EXTRACORPOREAL SHOCK WAVE LITHOTRIPSY Right 08/28/2021   Procedure: EXTRACORPOREAL SHOCK WAVE LITHOTRIPSY (ESWL);  Surgeon: Belva Agee, MD;  Location: [facility];  Service: Urology;  Laterality: Right;   SHOULDER ARTHROSCOPY W/ ROTATOR CUFF REPAIR Left 2019   SHOULDER ARTHROSCOPY WITH ROTATOR CUFF REPAIR AND SUBACROMIAL DECOMPRESSION Right 06/13/2020   Procedure: ARTHROSCOPY SHOULDER ROTATOR CUFF REPAIR, DEBRIDEMENT, SUBACROMIAL DECOMPRESSION;  Surgeon: Jones Broom, MD;  Location: [facility];  Service: Orthopedics;  Laterality: Right;   TONSILLECTOMY      His Family  History Is Significant For: Family History  Problem Relation Age of Onset   Hyperlipidemia Mother    Hypertension Mother    Diabetes Father    COPD Father    Heart disease Father    Chronic granulomatous disease Brother     His Social History Is Significant For: Social History   Socioeconomic History   Marital status: Married    Spouse name: Not on file   Number of children: Not on file   Years of education: Not on file   Highest education level: Not on file  Occupational History   Not on file  Tobacco Use   Smoking status: Never   Smokeless tobacco:  Never  Vaping Use   Vaping status: Never Used  Substance and Sexual Activity   Alcohol use: No   Drug use: No   Sexual activity: Yes  Other Topics Concern   Not on file  Social History Narrative   Caffiene tea (3 serving a day   Working:  [employer] daytime   Social Determinants of Corporate investment banker Strain: Not on file  Food Insecurity: No Food Insecurity (07/10/2023)   Hunger Vital Sign    Worried About Running Out of Food in the Last Year: Never true    Ran Out of Food in the Last Year: Never true  Transportation Needs: No Transportation Needs (07/10/2023)   PRAPARE - Administrator, Civil Service (Medical): No    Lack of Transportation (Non-Medical): No  Physical Activity: Not on file  Stress: Not on file  Social Connections: Not on file    His Allergies Are:  Allergies  Allergen Reactions   Adhesive [Tape] Other (See Comments)    Patient states "it ripped my skin when it came off"  :   His Current Medications Are:  Outpatient Encounter Medications as of 10/21/2023  Medication Sig   acetaminophen (TYLENOL) 325 MG tablet Take 2 tablets (650 mg total) by mouth every 6 (six) hours as needed for mild pain (or Fever >/= 101).   amitriptyline (ELAVIL) 25 MG tablet Take 1 tablet (25 mg total) by mouth at bedtime for sleep.   atorvastatin (LIPITOR) 80 MG tablet Take 1 tablet (80 mg) by mouth daily.   celecoxib (CELEBREX) 200 MG capsule Take 1 capsule (200 mg total) by mouth 2 (two) times daily for ARTHRITIS.   cholecalciferol (VITAMIN D3) 25 MCG (1000 UNIT) tablet Take 1,000 Units by mouth daily.   clopidogrel (PLAVIX) 75 MG tablet Take 1 tablet (75 mg) by mouth daily.   FLUoxetine (PROZAC) 20 MG capsule Take 1 capsule (20 mg total) by mouth daily. (Patient taking differently: Take 20 mg by mouth at bedtime.)   MAGNESIUM PO Take 1 tablet by mouth daily.   Multiple Vitamin (MULTIVITAMIN ADULT PO) Take 1 tablet by mouth daily.   Omega-3 Fatty Acids (FISH OIL  OMEGA-3 PO) Take 1 capsule by mouth daily.   Testosterone 1.62 % GEL Place 4 pumps onto the skin daily.   tiZANidine (ZANAFLEX) 4 MG tablet Take 4 mg by mouth every 8 (eight) hours as needed for muscle spasms.   traMADol (ULTRAM) 50 MG tablet Take 1 tablet (50 mg total) by mouth 2 (two) times daily as needed.   [DISCONTINUED] Testosterone 20.25 MG/ACT (1.62%) GEL Apply 4 pumps on skin daily (Patient taking differently: Apply 4 Pump topically daily.)   [DISCONTINUED] traMADol (ULTRAM) 50 MG tablet Take 1 tablet (50 mg) by mouth 2 times daily as needed.  No facility-administered encounter medications on file as of 10/21/2023.  :   Review of Systems:  Out of a complete 14 point review of systems, all are reviewed and negative with the exception of these symptoms as listed below:   Review of Systems  Neurological:        Pt tired after nights rest.  No apnea. Some snoring.  Some morning headache's.  ESS 14, FSS 40.   No prior sleep study.    Objective:  Neurological Exam  Physical Exam Physical Examination:   Vitals:   10/21/23 1115  BP: (!) 144/88  Pulse: 95  SpO2: 95%    General Examination: The patient is a very pleasant 57 y.o. male in no acute distress. He appears well-developed and well-nourished and well groomed.   HEENT: Normocephalic, atraumatic, pupils are equal, round and reactive to light, extraocular tracking is good without limitation to gaze excursion or nystagmus noted. Hearing is grossly intact. Face is symmetric with normal facial animation. Speech is clear with no dysarthria noted. There is no hypophonia. There is no lip, neck/head, jaw or voice tremor. Neck is supple with full range of passive and active motion. There are no carotid bruits on auscultation. Oropharynx exam reveals: mild mouth dryness, adequate dental hygiene and mild airway crowding, due to small airway entry.  Tonsils absent.  Mallampati class II.  Neck circumference 18-5/8 inches.  Minimal overbite  noted.  Tongue protrudes centrally and palate elevates symmetrically.  Chest: Clear to auscultation without wheezing, rhonchi or crackles noted.  Heart: S1+S2+0, regular and normal without murmurs, rubs or gallops noted.   Abdomen: Soft, non-tender and non-distended.  Extremities: There is trace edema left distal lower extremity.  This is not a new finding per wife's report.   Skin: Warm and dry without trophic changes noted.   Musculoskeletal: exam reveals no obvious joint deformities.   Neurologically:  Mental status: The patient is awake, alert and oriented in all 4 spheres. His immediate and remote memory, attention, language skills and fund of knowledge are appropriate. There is no evidence of aphasia, agnosia, apraxia or anomia. Speech is clear with normal prosody and enunciation. Thought process is linear. Mood is normal and affect is normal.  Cranial nerves II - XII are as described above under HEENT exam.  Motor exam: Normal bulk, moving all 4 extremities.  No obvious action or resting tremor.  Fine motor skills and coordination: grossly intact.  Cerebellar testing: No dysmetria or intention tremor. There is no truncal or gait ataxia.  Sensory exam: intact to light touch in the upper and lower extremities.  Gait, station and balance: He stands easily. No veering to one side is noted. No leaning to one side is noted. Posture is age-appropriate and stance is narrow based. Gait shows normal stride length and normal pace. No problems turning are noted.   Assessment and Plan:  In summary, Kemon Breakey. is a very pleasant 57 y.o.-year old male with an underlying medical history of hyperlipidemia, arthritis with status post bilateral rotator cuff surgeries, anxiety, depression, kidney stones with status post ESWL, history of TIA in July 2024, and mild obesity, whose history and physical exam are concerning for sleep disordered breathing, particularly obstructive sleep apnea (OSA).   While a laboratory attended sleep study is typically considered "gold standard" for evaluation of sleep disordered breathing, we mutually agreed to proceed with a home sleep test at this time.   I had a long chat with the patient and his  wife about my findings and the diagnosis of sleep apnea, particularly OSA, its prognosis and treatment options. We talked about medical/conservative treatments, surgical interventions and non-pharmacological approaches for symptom control. I explained, in particular, the risks and ramifications of untreated moderate to severe OSA, especially with respect to developing cardiovascular disease down the road, including congestive heart failure (CHF), difficult to treat hypertension, cardiac arrhythmias (particularly A-fib), neurovascular complications including TIA, stroke and dementia. Even type 2 diabetes has, in part, been linked to untreated OSA. Symptoms of untreated OSA may include (but may not be limited to) daytime sleepiness, nocturia (i.e. frequent nighttime urination), memory problems, mood irritability and suboptimally controlled or worsening mood disorder such as depression and/or anxiety, lack of energy, lack of motivation, physical discomfort, as well as recurrent headaches, especially morning or nocturnal headaches. We talked about the importance of maintaining a healthy lifestyle and striving for healthy weight. In addition, we talked about the importance of striving for and maintaining good sleep hygiene. I recommended a sleep study at this time. I outlined the differences between a laboratory attended sleep study which is considered more comprehensive and accurate over the option of a home sleep test (HST); the latter may lead to underestimation of sleep disordered breathing in some instances and does not help with diagnosing upper airway resistance syndrome and is not accurate enough to diagnose primary central sleep apnea typically. I outlined possible surgical  and non-surgical treatment options of OSA, including the use of a positive airway pressure (PAP) device (i.e. CPAP, AutoPAP/APAP or BiPAP in certain circumstances), a custom-made dental device (aka oral appliance, which would require a referral to a specialist dentist or orthodontist typically, and is generally speaking not considered for patients with full dentures or edentulous state), upper airway surgical options, such as traditional UPPP (which is not considered a first-line treatment) or the Inspire device (hypoglossal nerve stimulator, which would involve a referral for consultation with an ENT surgeon, after careful selection, following inclusion criteria - also not first-line treatment). I explained the PAP treatment option to the patient in detail, as this is generally considered first-line treatment.  The patient indicated that he would be willing to try PAP therapy, if the need arises. I explained the importance of being compliant with PAP treatment, not only for insurance purposes but primarily to improve patient's symptoms symptoms, and for the patient's long term health benefit, including to reduce His cardiovascular risks longer-term.    We will pick up our discussion about the next steps and treatment options after testing.  We will keep them posted as to the test results by phone call and/or MyChart messaging where possible.  We will plan to follow-up in sleep clinic accordingly as well.  I answered all their questions today and the patient and his wife were in agreement.   I encouraged them to call with any interim questions, concerns, problems or updates or email [place] through MyChart.  Generally speaking, sleep test authorizations may take up to 2 weeks, sometimes less, sometimes longer, the patient is encouraged to get in touch with [place] if they do not hear back from the sleep lab staff directly within the next 2 weeks.  Thank you very much for allowing me to participate in the care of this  nice patient. If I can be of any further assistance to you please do not hesitate to talk to me.  Sincerely,   Matthew Foley, MD, PhD

## 2023-11-04 ENCOUNTER — Ambulatory Visit: Payer: 59 | Admitting: Neurology

## 2023-11-04 DIAGNOSIS — R519 Headache, unspecified: Secondary | ICD-10-CM

## 2023-11-04 DIAGNOSIS — Z820 Family history of epilepsy and other diseases of the nervous system: Secondary | ICD-10-CM

## 2023-11-04 DIAGNOSIS — E66811 Obesity, class 1: Secondary | ICD-10-CM

## 2023-11-04 DIAGNOSIS — G4733 Obstructive sleep apnea (adult) (pediatric): Secondary | ICD-10-CM | POA: Diagnosis not present

## 2023-11-04 DIAGNOSIS — G479 Sleep disorder, unspecified: Secondary | ICD-10-CM

## 2023-11-04 DIAGNOSIS — R0683 Snoring: Secondary | ICD-10-CM

## 2023-11-04 DIAGNOSIS — G459 Transient cerebral ischemic attack, unspecified: Secondary | ICD-10-CM

## 2023-11-04 DIAGNOSIS — Z9189 Other specified personal risk factors, not elsewhere classified: Secondary | ICD-10-CM

## 2023-11-04 DIAGNOSIS — R351 Nocturia: Secondary | ICD-10-CM

## 2023-11-05 ENCOUNTER — Other Ambulatory Visit (HOSPITAL_BASED_OUTPATIENT_CLINIC_OR_DEPARTMENT_OTHER): Payer: Self-pay

## 2023-11-21 NOTE — Addendum Note (Signed)
Addended by: Huston Foley on: 11/21/2023 04:48 PM   Modules accepted: Orders

## 2023-11-21 NOTE — Procedures (Signed)
[facility]  HOME SLEEP TEST (SANSA) REPORT (Mail-Out Device):   STUDY DATE: 11/06/2023  DOB: Dec 07, 1966  MRN: [id number]  ORDERING CLINICIAN: Huston Foley, MD, PhD   REFERRING CLINICIAN: Dr. Marjory Lies  CLINICAL INFORMATION/HISTORY: 57 year old male with an underlying medical history of hyperlipidemia, arthritis with status post bilateral rotator cuff surgeries, anxiety, depression, kidney stones with status post ESWL, history of TIA in July 2024, and mild obesity, who reports snoring and excessive daytime somnolence, and nonrestorative sleep.    PATIENT'S LAST REPORTED EPWORTH SLEEPINESS SCORE (ESS): 14/24.  BMI (at the time of sleep clinic visit and/or test date): 31.9 kg/m  FINDINGS:   Study Protocol:    The SANSA single-point-of-skin-contact chest-worn sensor - an FDA and DOT approved type 4 home sleep test device - measures eight physiological channels,  including blood oxygen saturation (measured via PPG [photoplethysmography]), EKG-derived heart rate, respiratory effort, chest movement (measured via accelerometer), snoring, body position, and actigraphy. The device is designed to be worn for up to 10 hours per study.   Sleep Summary:   Total Recording Time (hours, min): 8 hours, 59 min  Total Effective Sleep Time (hours, min):  7 hours, 39 min  Sleep Efficiency (%):    88%   Respiratory Indices:   Calculated sAHI (per hour):  8.7/hour         Oxygen Saturation Statistics:    Oxygen Saturation (%) Mean: 95.9%   Minimum oxygen saturation (%):                 69.1%   O2 Saturation Range (%): 69.1-99.4%    Pulse Rate Statistics:   Pulse Mean (bpm):    73/min    Pulse Range (55-102/min)   Snoring: Intermittent, mild  IMPRESSION/DIAGNOSES:   OSA (obstructive sleep apnea)  RECOMMENDATIONS:   This home sleep test demonstrates overall mild obstructive sleep apnea with a total AHI of 8.7/hour and O2 nadir of 69.1%. Snoring was detected,  in the mild intermittent range. Given the patient's medical history and sleep related complaints, therapy with a  positive airway pressure device is a reasonable first-line choice and clinically recommended. Treatment can be achieved in the form of autoPAP trial/titration at home for now. A full night, in-lab PAP titration study may aid in improving proper treatment settings and with mask fit, if needed, down the road. Alternative treatments may include weight loss (where appropriate) along with avoidance of the supine sleep position (if possible), or an oral appliance in appropriate candidates.   Please note that untreated obstructive sleep apnea may carry additional perioperative morbidity. Patients with significant obstructive sleep apnea should receive perioperative PAP therapy and the surgeons and particularly the anesthesiologist should be informed of the diagnosis and the severity of the sleep disordered breathing. The patient should be cautioned not to drive, work at heights, or operate dangerous or heavy equipment when tired or sleepy. Review and reiteration of good sleep hygiene measures should be pursued with any patient. Other causes of the patient's symptoms, including circadian rhythm disturbances, an underlying mood disorder, medication effect and/or an underlying medical problem cannot be ruled out based on this test. Clinical correlation is recommended.  The patient and his referring provider will be notified of the test results. The patient will be seen in follow up in sleep clinic at [facility], as necessary.  I certify that I have reviewed the raw data recording prior to the issuance of this report in accordance with the standards of the American Academy of Sleep  Medicine (AASM).    INTERPRETING PHYSICIAN:   Huston Foley, MD, PhD Medical Director, [facility]) Diplomat, ABPN (Neurology and Sleep)   [facility] [address]  [address] [place], [place] [address] [phone]

## 2023-11-25 ENCOUNTER — Other Ambulatory Visit (HOSPITAL_BASED_OUTPATIENT_CLINIC_OR_DEPARTMENT_OTHER): Payer: Self-pay

## 2023-11-26 ENCOUNTER — Telehealth: Payer: Self-pay | Admitting: Neurology

## 2023-11-26 NOTE — Telephone Encounter (Signed)
Pt states he received a mychart message with his cpap results and would like to go over them. Requesting call back- Morrie Sheldon

## 2023-11-26 NOTE — Telephone Encounter (Signed)
I spoke with the patient and discussed the sleep study results as noted below by Dr. Frances Furbish.  The patient verbalized understanding.  We discussed the insurance compliance requirements (which includes using the machine at least 4 hours at night and also being seen by our office between 30 and 90 days after setup). The patient is amenable to trying autopap. Will refer to [employer] [facility] [employer] Pt will watch for a call within 1 week. His and his wife's questions were answered. Pt was scheduled for a mychart video visit on 02/25/24 at 11:30 am. He verbalized appreciation for the call.  Referral sent to [facility].      Huston Foley, MD 11/21/2023  4:48 PM EST     Patient referred by Dr. Marjory Lies, seen by me on 10/21/2023, patient had HST on 11/06/2023.     Please call and notify the patient that the recent home sleep test showed obstructive sleep apnea. OSA is overall mild, but worth treating to see if he feels better after treatment. To that end I recommend treatment for this in the form of autoPAP, which means, that we don't have to bring him in for a sleep study with CPAP, but will let his try an autoPAP machine at home, through a DME company (of his choice, or as per insurance requirement). The DME representative will educate him on how to use the machine, how to put the mask on, etc. I have placed an order in the chart. Please send referral, talk to patient, send report to referring MD. We will need a FU in sleep clinic in about 2.-3 months post-PAP set up (which is usually an insurance-mandated appointment to monitor compliance), please arrange that with me or one of our NPs. Please also go over the need for compliance with treatment (including the insurance-imposed minimum compliance percentage). Thanks,   Huston Foley, MD, PhD [facility])

## 2023-11-27 NOTE — Telephone Encounter (Signed)
New, Maryella Shivers, Otilio Jefferson, RN; Alain Honey; Trilby Leaver, Deretha Emory; 1 other Received, thank you!

## 2023-12-05 ENCOUNTER — Other Ambulatory Visit (HOSPITAL_BASED_OUTPATIENT_CLINIC_OR_DEPARTMENT_OTHER): Payer: Self-pay

## 2023-12-10 DIAGNOSIS — G4733 Obstructive sleep apnea (adult) (pediatric): Secondary | ICD-10-CM | POA: Diagnosis not present

## 2023-12-21 DIAGNOSIS — G4733 Obstructive sleep apnea (adult) (pediatric): Secondary | ICD-10-CM | POA: Diagnosis not present

## 2024-01-06 ENCOUNTER — Other Ambulatory Visit (HOSPITAL_BASED_OUTPATIENT_CLINIC_OR_DEPARTMENT_OTHER): Payer: Self-pay

## 2024-01-07 ENCOUNTER — Other Ambulatory Visit (HOSPITAL_BASED_OUTPATIENT_CLINIC_OR_DEPARTMENT_OTHER): Payer: Self-pay

## 2024-01-07 MED ORDER — TESTOSTERONE 1.62 % TD GEL
4.0000 | Freq: Every day | TRANSDERMAL | 2 refills | Status: AC
Start: 2024-01-07 — End: ?
  Filled 2024-01-07: qty 450, 90d supply, fill #0
  Filled 2024-04-14: qty 225, 45d supply, fill #1

## 2024-01-10 DIAGNOSIS — G4733 Obstructive sleep apnea (adult) (pediatric): Secondary | ICD-10-CM | POA: Diagnosis not present

## 2024-01-27 ENCOUNTER — Other Ambulatory Visit (HOSPITAL_BASED_OUTPATIENT_CLINIC_OR_DEPARTMENT_OTHER): Payer: Self-pay

## 2024-02-10 DIAGNOSIS — G4733 Obstructive sleep apnea (adult) (pediatric): Secondary | ICD-10-CM | POA: Diagnosis not present

## 2024-02-17 ENCOUNTER — Other Ambulatory Visit (HOSPITAL_BASED_OUTPATIENT_CLINIC_OR_DEPARTMENT_OTHER): Payer: Self-pay

## 2024-02-24 NOTE — Progress Notes (Unsigned)
 [facility] [address] [place]. [place] [address] [phone]       OFFICE FOLLOW UP NOTE  Mr. Matthew Mooney. Date of Birth:  January 30, 1966 Medical Record Number:  [id number]    Primary neurologist: Dr. Frances Furbish Reason for visit: Initial CPAP follow-up  Virtual Visit via Video Note  I connected with [name]. on 02/25/24 at 11:30 AM EDT by a video enabled telemedicine application and verified that I am speaking with the correct person using two identifiers.  Location: Patient: at home Provider: in office, [facility]   I discussed the limitations of evaluation and management by telemedicine and the availability of in person appointments. The patient expressed understanding and agreed to proceed.   SUBJECTIVE:   Follow-up visit:  Prior visit: 10/21/2023 with Dr. Frances Furbish (initial consult visit)  Brief HPI:   Matthew Mooney. Is a 58 y.o. male who was seen by Dr. Frances Furbish on 10/21/2023 for concern of underlying sleep apnea with complaints of snoring, excessive daytime somnolence and nonrestorative sleep. ESS 14/24. HST 10/2023 showed overall mild sleep apnea with total AHI 8.7 and O2 nadir of 69%. AutoPAP set up 11/2023.     Interval history:  Patient returns for initial CPAP follow up via MyChart video visit.  Reports overall doing well with CPAP.  Currently using nasal mask, can have leaks occasionally but will resolve after repositioning.  Notes improvement of daytime energy levels and sleep quality. ESS 4/24.  No questions or concerns today.          ROS:   14 system review of systems performed and negative with exception of those listed in HPI  PMH:  Past Medical History:  Diagnosis Date   Anxiety    Arthritis    Concussion    due to falling off roof/  mulltiple fracture   Depression    History of kidney stones    TIA (transient ischemic attack)     PSH:  Past Surgical History:  Procedure Laterality Date   EXTRACORPOREAL SHOCK  WAVE LITHOTRIPSY Right 08/28/2021   Procedure: EXTRACORPOREAL SHOCK WAVE LITHOTRIPSY (ESWL);  Surgeon: Belva Agee, MD;  Location: [facility];  Service: Urology;  Laterality: Right;   SHOULDER ARTHROSCOPY W/ ROTATOR CUFF REPAIR Left 2019   SHOULDER ARTHROSCOPY WITH ROTATOR CUFF REPAIR AND SUBACROMIAL DECOMPRESSION Right 06/13/2020   Procedure: ARTHROSCOPY SHOULDER ROTATOR CUFF REPAIR, DEBRIDEMENT, SUBACROMIAL DECOMPRESSION;  Surgeon: Jones Broom, MD;  Location: [facility];  Service: Orthopedics;  Laterality: Right;   TONSILLECTOMY      Social History:  Social History   Socioeconomic History   Marital status: Married    Spouse name: Not on file   Number of children: Not on file   Years of education: Not on file   Highest education level: Not on file  Occupational History   Not on file  Tobacco Use   Smoking status: Never   Smokeless tobacco: Never  Vaping Use   Vaping status: Never Used  Substance and Sexual Activity   Alcohol use: No   Drug use: No   Sexual activity: Yes  Other Topics Concern   Not on file  Social History Narrative   Caffiene tea (3 serving a day   Working:  [employer] daytime   Social Drivers of Corporate investment banker Strain: Not on file  Food Insecurity: No Food Insecurity (07/10/2023)   Hunger Vital Sign    Worried About Running Out of Food in  the Last Year: Never true    Ran Out of Food in the Last Year: Never true  Transportation Needs: No Transportation Needs (07/10/2023)   PRAPARE - Administrator, Civil Service (Medical): No    Lack of Transportation (Non-Medical): No  Physical Activity: Not on file  Stress: Not on file  Social Connections: Not on file  Intimate Partner Violence: Not At Risk (07/10/2023)   Humiliation, Afraid, Rape, and Kick questionnaire    Fear of Current or Ex-Partner: No    Emotionally Abused: No    Physically Abused: No    Sexually Abused: No    Family History:   Family History  Problem Relation Age of Onset   Hyperlipidemia Mother    Hypertension Mother    Diabetes Father    COPD Father    Heart disease Father    Chronic granulomatous disease Brother     Medications:   Current Outpatient Medications on File Prior to Visit  Medication Sig Dispense Refill   acetaminophen (TYLENOL) 325 MG tablet Take 2 tablets (650 mg total) by mouth every 6 (six) hours as needed for mild pain (or Fever >/= 101).     amitriptyline (ELAVIL) 25 MG tablet Take 1 tablet (25 mg total) by mouth at bedtime for sleep. 90 tablet 3   atorvastatin (LIPITOR) 80 MG tablet Take 1 tablet (80 mg) by mouth daily. 90 tablet 3   celecoxib (CELEBREX) 200 MG capsule Take 1 capsule (200 mg total) by mouth 2 (two) times daily for ARTHRITIS. 180 capsule 1   cholecalciferol (VITAMIN D3) 25 MCG (1000 UNIT) tablet Take 1,000 Units by mouth daily.     clopidogrel (PLAVIX) 75 MG tablet Take 1 tablet (75 mg) by mouth daily. 90 tablet 4   FLUoxetine (PROZAC) 20 MG capsule Take 1 capsule (20 mg total) by mouth daily. (Patient taking differently: Take 20 mg by mouth at bedtime.) 90 capsule 3   MAGNESIUM PO Take 1 tablet by mouth daily.     Multiple Vitamin (MULTIVITAMIN ADULT PO) Take 1 tablet by mouth daily.     Omega-3 Fatty Acids (FISH OIL OMEGA-3 PO) Take 1 capsule by mouth daily.     Testosterone 1.62 % GEL Place 4 pumps onto the skin daily. 225 g 2   Testosterone 1.62 % GEL Place 4 pumps onto the skin daily. 225 g 2   tiZANidine (ZANAFLEX) 4 MG tablet Take 4 mg by mouth every 8 (eight) hours as needed for muscle spasms.     traMADol (ULTRAM) 50 MG tablet Take 1 tablet (50 mg total) by mouth 2 (two) times daily as needed. 180 tablet 2   No current facility-administered medications on file prior to visit.    Allergies:   Allergies  Allergen Reactions   Adhesive [Tape] Other (See Comments)    Patient states "it ripped my skin when it came off"      OBJECTIVE:  Physical  Exam  General: well developed, well nourished, seated, in no evident distress  Neurologic Exam Mental Status: Awake and fully alert. Oriented to place and time. Recent and remote memory intact. Attention span, concentration and fund of knowledge appropriate. Mood and affect appropriate.         ASSESSMENT/PLAN: Weyman Bogdon. is a 58 y.o. year old male    OSA on CPAP : Compliance report shows satisfactory usage with optimal residual AHI.  Continue current pressure settings.  Discussed continued nightly usage with ensuring greater than 4 hours  nightly for optimal benefit and per insurance purposes.  Continue to follow with DME company for any needed supplies or CPAP related concerns     Follow up in 6 months via MyChart video visit or call earlier if needed   CC:  PCP: Lise Auer, MD    I spent 15 minutes of face-to-face and non-face-to-face time with patient via MyChart video visit.  This included previsit chart review, lab review, study review, order entry, electronic health record documentation, patient education and discussion regarding above diagnoses and treatment plan and answered all other questions to patient's satisfaction  Ihor Austin, [facility]  [facility] [address] [place], [place] [address]  Phone [phone] Fax [phone] Note: This document was prepared with digital dictation and possible smart phrase technology. Any transcriptional errors that result from this process are unintentional.  *patient was seen for Butch Penny, NP, was unable to switch prior to patient being seen

## 2024-02-25 ENCOUNTER — Telehealth (INDEPENDENT_AMBULATORY_CARE_PROVIDER_SITE_OTHER): Payer: 59 | Admitting: Adult Health

## 2024-02-25 ENCOUNTER — Encounter: Payer: Self-pay | Admitting: Adult Health

## 2024-02-25 DIAGNOSIS — G4733 Obstructive sleep apnea (adult) (pediatric): Secondary | ICD-10-CM

## 2024-03-09 DIAGNOSIS — G4733 Obstructive sleep apnea (adult) (pediatric): Secondary | ICD-10-CM | POA: Diagnosis not present

## 2024-03-16 ENCOUNTER — Other Ambulatory Visit (HOSPITAL_BASED_OUTPATIENT_CLINIC_OR_DEPARTMENT_OTHER): Payer: Self-pay

## 2024-03-16 DIAGNOSIS — M6530 Trigger finger, unspecified finger: Secondary | ICD-10-CM | POA: Diagnosis not present

## 2024-03-16 DIAGNOSIS — M65342 Trigger finger, left ring finger: Secondary | ICD-10-CM | POA: Diagnosis not present

## 2024-03-16 MED ORDER — TIZANIDINE HCL 4 MG PO TABS
2.0000 mg | ORAL_TABLET | Freq: Every evening | ORAL | 1 refills | Status: DC | PRN
  Filled 2024-03-16: qty 90, 90d supply, fill #0
  Filled 2024-06-15: qty 90, 90d supply, fill #1

## 2024-03-17 ENCOUNTER — Other Ambulatory Visit (HOSPITAL_BASED_OUTPATIENT_CLINIC_OR_DEPARTMENT_OTHER): Payer: Self-pay

## 2024-03-17 MED ORDER — TRAMADOL HCL 50 MG PO TABS
50.0000 mg | ORAL_TABLET | Freq: Two times a day (BID) | ORAL | 2 refills | Status: AC | PRN
Start: 2024-03-17 — End: ?
  Filled 2024-03-17: qty 180, 90d supply, fill #0
  Filled 2024-06-08 – 2024-06-12 (×2): qty 180, 90d supply, fill #1
  Filled 2024-09-13: qty 180, 90d supply, fill #2

## 2024-04-09 DIAGNOSIS — G4733 Obstructive sleep apnea (adult) (pediatric): Secondary | ICD-10-CM | POA: Diagnosis not present

## 2024-04-14 ENCOUNTER — Other Ambulatory Visit (HOSPITAL_BASED_OUTPATIENT_CLINIC_OR_DEPARTMENT_OTHER): Payer: Self-pay

## 2024-04-14 MED ORDER — FLUOXETINE HCL 20 MG PO CAPS
20.0000 mg | ORAL_CAPSULE | Freq: Every day | ORAL | 3 refills | Status: AC
  Filled 2024-04-14: qty 90, 90d supply, fill #0
  Filled 2024-07-12: qty 90, 90d supply, fill #1
  Filled 2024-10-11: qty 90, 90d supply, fill #2
  Filled 2025-01-06: qty 90, 90d supply, fill #3

## 2024-04-14 MED ORDER — CELECOXIB 200 MG PO CAPS
200.0000 mg | ORAL_CAPSULE | Freq: Two times a day (BID) | ORAL | 2 refills | Status: DC
  Filled 2024-04-14: qty 180, 90d supply, fill #0
  Filled 2024-07-12: qty 180, 90d supply, fill #1
  Filled 2024-10-11: qty 180, 90d supply, fill #2

## 2024-05-04 ENCOUNTER — Other Ambulatory Visit (HOSPITAL_BASED_OUTPATIENT_CLINIC_OR_DEPARTMENT_OTHER): Payer: Self-pay

## 2024-05-04 MED ORDER — AMOXICILLIN-POT CLAVULANATE 875-125 MG PO TABS
1.0000 | ORAL_TABLET | Freq: Two times a day (BID) | ORAL | 0 refills | Status: AC
  Filled 2024-05-04: qty 14, 7d supply, fill #0

## 2024-05-09 DIAGNOSIS — G4733 Obstructive sleep apnea (adult) (pediatric): Secondary | ICD-10-CM | POA: Diagnosis not present

## 2024-05-21 ENCOUNTER — Other Ambulatory Visit (HOSPITAL_BASED_OUTPATIENT_CLINIC_OR_DEPARTMENT_OTHER): Payer: Self-pay

## 2024-06-09 ENCOUNTER — Other Ambulatory Visit (HOSPITAL_BASED_OUTPATIENT_CLINIC_OR_DEPARTMENT_OTHER): Payer: Self-pay

## 2024-06-09 DIAGNOSIS — G4733 Obstructive sleep apnea (adult) (pediatric): Secondary | ICD-10-CM | POA: Diagnosis not present

## 2024-06-10 ENCOUNTER — Other Ambulatory Visit (HOSPITAL_BASED_OUTPATIENT_CLINIC_OR_DEPARTMENT_OTHER): Payer: Self-pay

## 2024-06-12 ENCOUNTER — Other Ambulatory Visit (HOSPITAL_BASED_OUTPATIENT_CLINIC_OR_DEPARTMENT_OTHER): Payer: Self-pay

## 2024-06-16 ENCOUNTER — Other Ambulatory Visit (HOSPITAL_BASED_OUTPATIENT_CLINIC_OR_DEPARTMENT_OTHER): Payer: Self-pay

## 2024-06-16 MED ORDER — TESTOSTERONE 1.62 % TD GEL
TRANSDERMAL | 2 refills | Status: AC
  Filled 2024-06-16 (×2): qty 450, 90d supply, fill #0
  Filled 2024-09-13: qty 450, 90d supply, fill #1

## 2024-07-09 DIAGNOSIS — G4733 Obstructive sleep apnea (adult) (pediatric): Secondary | ICD-10-CM | POA: Diagnosis not present

## 2024-07-13 ENCOUNTER — Other Ambulatory Visit (HOSPITAL_BASED_OUTPATIENT_CLINIC_OR_DEPARTMENT_OTHER): Payer: Self-pay

## 2024-07-25 ENCOUNTER — Other Ambulatory Visit (HOSPITAL_BASED_OUTPATIENT_CLINIC_OR_DEPARTMENT_OTHER): Payer: Self-pay

## 2024-07-27 ENCOUNTER — Other Ambulatory Visit (HOSPITAL_BASED_OUTPATIENT_CLINIC_OR_DEPARTMENT_OTHER): Payer: Self-pay

## 2024-07-27 MED ORDER — ATORVASTATIN CALCIUM 80 MG PO TABS
80.0000 mg | ORAL_TABLET | Freq: Every day | ORAL | 3 refills | Status: AC
  Filled 2024-07-27: qty 90, 90d supply, fill #0
  Filled 2024-10-18: qty 90, 90d supply, fill #1
  Filled 2025-01-12: qty 90, 90d supply, fill #2

## 2024-08-06 ENCOUNTER — Other Ambulatory Visit (HOSPITAL_BASED_OUTPATIENT_CLINIC_OR_DEPARTMENT_OTHER): Payer: Self-pay

## 2024-08-09 DIAGNOSIS — G4733 Obstructive sleep apnea (adult) (pediatric): Secondary | ICD-10-CM | POA: Diagnosis not present

## 2024-08-22 ENCOUNTER — Other Ambulatory Visit (HOSPITAL_BASED_OUTPATIENT_CLINIC_OR_DEPARTMENT_OTHER): Payer: Self-pay

## 2024-08-24 ENCOUNTER — Other Ambulatory Visit (HOSPITAL_BASED_OUTPATIENT_CLINIC_OR_DEPARTMENT_OTHER): Payer: Self-pay

## 2024-08-24 MED ORDER — AMITRIPTYLINE HCL 25 MG PO TABS
25.0000 mg | ORAL_TABLET | Freq: Every day | ORAL | 3 refills | Status: AC
  Filled 2024-08-24: qty 30, 30d supply, fill #0
  Filled 2024-08-24: qty 60, 60d supply, fill #0
  Filled 2024-11-23: qty 90, 90d supply, fill #1

## 2024-08-27 NOTE — Progress Notes (Signed)
 [facility] [address] [place]. [place] [address] [phone]       OFFICE FOLLOW UP NOTE  Matthew Mooney. Date of Birth:  May 09, 1966 Medical Record Number:  [id number]    Primary neurologist: Dr. Buck Reason for visit: CPAP follow-up  Virtual Visit via Video Note  I connected with Matthew Mooney. on 08/31/24 at  8:15 AM EDT by a video enabled telemedicine application and verified that I am speaking with the correct person using two identifiers.  Location: Patient: at home Provider: in office, [facility]   I discussed the limitations of evaluation and management by telemedicine and the availability of in person appointments. The patient expressed understanding and agreed to proceed.   SUBJECTIVE:   Follow-up visit:  Prior visit: 02/25/2024  Brief HPI:   Matthew Mooney. Is a 58 y.o. male who was seen by Dr. Buck on 10/21/2023 for concern of underlying sleep apnea with complaints of snoring, excessive daytime somnolence and nonrestorative sleep. ESS 14/24. HST 10/2023 showed overall mild sleep apnea with total AHI 8.7 and O2 nadir of 69%. AutoPAP set up 11/2023.   At prior visit, compliance report showed excellent usage at 100% and optimal residual AHI at 3.9.    Interval history:  Patient returns for 51-month CPAP follow-up visit via MyChart video visit.  He continues to do well with CPAP and tolerates nasal mask without difficulty.  He does have occasional leaks likely due to facial hair and usually will resolve after repositioning.  He has missed days over the past month but reports he took a couple of different occasions and did not bring his machine.  He did not notice any significant changes while not using.  Since returning back home over the past week, he has been using machine consistently as noted below.  Followed by [employer] and up to date on supplies.  No questions or concerns at this time.           ROS:   14 system  review of systems performed and negative with exception of those listed in HPI  PMH:  Past Medical History:  Diagnosis Date   Anxiety    Arthritis    Concussion    due to falling off roof/  mulltiple fracture   Depression    History of kidney stones    TIA (transient ischemic attack)     PSH:  Past Surgical History:  Procedure Laterality Date   EXTRACORPOREAL SHOCK WAVE LITHOTRIPSY Right 08/28/2021   Procedure: EXTRACORPOREAL SHOCK WAVE LITHOTRIPSY (ESWL);  Surgeon: Rosalind Zachary NOVAK, MD;  Location: [facility];  Service: Urology;  Laterality: Right;   SHOULDER ARTHROSCOPY W/ ROTATOR CUFF REPAIR Left 2019   SHOULDER ARTHROSCOPY WITH ROTATOR CUFF REPAIR AND SUBACROMIAL DECOMPRESSION Right 06/13/2020   Procedure: ARTHROSCOPY SHOULDER ROTATOR CUFF REPAIR, DEBRIDEMENT, SUBACROMIAL DECOMPRESSION;  Surgeon: Dozier Soulier, MD;  Location: [facility];  Service: Orthopedics;  Laterality: Right;   TONSILLECTOMY      Social History:  Social History   Socioeconomic History   Marital status: Married    Spouse name: Not on file   Number of children: Not on file   Years of education: Not on file   Highest education level: Not on file  Occupational History   Not on file  Tobacco Use   Smoking status: Never   Smokeless tobacco: Never  Vaping Use   Vaping status: Never Used  Substance and Sexual Activity  Alcohol use: No   Drug use: No   Sexual activity: Yes  Other Topics Concern   Not on file  Social History Narrative   Caffiene tea (3 serving a day   Working:  [employer] daytime   Social Drivers of Corporate investment banker Strain: Not on file  Food Insecurity: No Food Insecurity (07/10/2023)   Hunger Vital Sign    Worried About Running Out of Food in the Last Year: Never true    Ran Out of Food in the Last Year: Never true  Transportation Needs: No Transportation Needs (07/10/2023)   PRAPARE - Administrator, Civil Service (Medical):  No    Lack of Transportation (Non-Medical): No  Physical Activity: Not on file  Stress: Not on file  Social Connections: Not on file  Intimate Partner Violence: Not At Risk (07/10/2023)   Humiliation, Afraid, Rape, and Kick questionnaire    Fear of Current or Ex-Partner: No    Emotionally Abused: No    Physically Abused: No    Sexually Abused: No    Family History:  Family History  Problem Relation Age of Onset   Hyperlipidemia Mother    Hypertension Mother    Diabetes Father    COPD Father    Heart disease Father    Chronic granulomatous disease Brother     Medications:   Current Outpatient Medications on File Prior to Visit  Medication Sig Dispense Refill   acetaminophen  (TYLENOL ) 325 MG tablet Take 2 tablets (650 mg total) by mouth every 6 (six) hours as needed for mild pain (or Fever >/= 101).     amitriptyline  (ELAVIL ) 25 MG tablet Take 1 tablet (25 mg total) by mouth at bedtime for sleep. 90 tablet 3   amoxicillin -clavulanate (AUGMENTIN ) 875-125 MG tablet Take 1 tablet by mouth 2 (two) times daily. 14 tablet 0   atorvastatin  (LIPITOR) 80 MG tablet Take 1 tablet (80 mg total) by mouth daily. 90 tablet 3   celecoxib  (CELEBREX ) 200 MG capsule Take 1 capsule (200 mg total) by mouth 2 (two) times daily for ARTHRITIS. 180 capsule 2   cholecalciferol (VITAMIN D3) 25 MCG (1000 UNIT) tablet Take 1,000 Units by mouth daily.     clopidogrel  (PLAVIX ) 75 MG tablet Take 1 tablet (75 mg) by mouth daily. 90 tablet 4   FLUoxetine  (PROZAC ) 20 MG capsule Take 1 capsule (20 mg total) by mouth daily. 90 capsule 3   MAGNESIUM  PO Take 1 tablet by mouth daily.     Multiple Vitamin (MULTIVITAMIN ADULT PO) Take 1 tablet by mouth daily.     Omega-3 Fatty Acids (FISH OIL OMEGA-3 PO) Take 1 capsule by mouth daily.     Testosterone  1.62 % GEL Place 4 pumps onto the skin daily. 225 g 2   Testosterone  1.62 % GEL Place 4 pumps onto the skin daily. 225 g 2   Testosterone  1.62 % GEL Apply 4 Pumps on skin  daily 450 g 2   tiZANidine  (ZANAFLEX ) 4 MG tablet Take 4 mg by mouth every 8 (eight) hours as needed for muscle spasms.     tiZANidine  (ZANAFLEX ) 4 MG tablet Take 0.5-1 tablets (2-4 mg total) by mouth at bedtime as needed for muscle spasm. 90 tablet 1   traMADol  (ULTRAM ) 50 MG tablet Take 1 tablet (50 mg total) by mouth 2 (two) times daily as needed. 180 tablet 2   No current facility-administered medications on file prior to visit.    Allergies:   Allergies  Allergen Reactions   Adhesive [Tape] Other (See Comments)    Patient states it ripped my skin when it came off      OBJECTIVE:  Physical Exam  General: well developed, well nourished, seated, in no evident distress  Neurologic Exam Mental Status: Awake and fully alert. Oriented to place and time. Recent and remote memory intact. Attention span, concentration and fund of knowledge appropriate. Mood and affect appropriate.         ASSESSMENT/PLAN: Zimir Kittleson. is a 58 y.o. year old male    OSA on CPAP :  Compliance report shows suboptimal usage due to recent vacations.  He has been using consistently over the past week since returning home.  It was encouraged to bring machine with him while on vacation to ensure nightly usage. Continue current pressure settings of 7-13 with EPR 3.   Discussed importance of nightly usage with ensuring greater than 4 hours nightly for optimal benefit and per insurance purposes.   DME [employer], continue to follow with DME company for any needed supplies or CPAP related concerns CPAP set up 11/2023     Follow up in 1 year via MyChart video visit or call earlier if needed   CC:  PCP: Fernand Tracey LABOR, MD    I personally spent a total of 15 minutes in the care of the patient today including preparing to see the patient, counseling and educating, placing orders, and documenting clinical information in the EHR.   Harlene Bogaert, AGNP-BC  [facility] [address] [place], [place] [address]  Phone [phone] Fax [phone] Note: This document was prepared with digital dictation and possible smart phrase technology. Any transcriptional errors that result from this process are unintentional.  *patient was seen for Duwaine Russell, NP, was unable to switch prior to patient being seen

## 2024-08-31 ENCOUNTER — Telehealth (INDEPENDENT_AMBULATORY_CARE_PROVIDER_SITE_OTHER): Admitting: Adult Health

## 2024-08-31 ENCOUNTER — Encounter: Payer: Self-pay | Admitting: Adult Health

## 2024-08-31 DIAGNOSIS — G4733 Obstructive sleep apnea (adult) (pediatric): Secondary | ICD-10-CM | POA: Diagnosis not present

## 2024-08-31 NOTE — Progress Notes (Signed)
 Cainen Burnham D, CMA  Joylene Bradley; Garcia, Patricia; Ziegler, Melissa; Tucker, Dolanda; Cain, Kerrick New orders have been placed for the above pt, DOB:[id number] Thanks

## 2024-09-01 DIAGNOSIS — D1801 Hemangioma of skin and subcutaneous tissue: Secondary | ICD-10-CM | POA: Diagnosis not present

## 2024-09-01 DIAGNOSIS — L570 Actinic keratosis: Secondary | ICD-10-CM | POA: Diagnosis not present

## 2024-09-01 DIAGNOSIS — L814 Other melanin hyperpigmentation: Secondary | ICD-10-CM | POA: Diagnosis not present

## 2024-09-01 DIAGNOSIS — L821 Other seborrheic keratosis: Secondary | ICD-10-CM | POA: Diagnosis not present

## 2024-09-09 DIAGNOSIS — G4733 Obstructive sleep apnea (adult) (pediatric): Secondary | ICD-10-CM | POA: Diagnosis not present

## 2024-09-13 ENCOUNTER — Other Ambulatory Visit (HOSPITAL_BASED_OUTPATIENT_CLINIC_OR_DEPARTMENT_OTHER): Payer: Self-pay

## 2024-09-14 ENCOUNTER — Other Ambulatory Visit (HOSPITAL_BASED_OUTPATIENT_CLINIC_OR_DEPARTMENT_OTHER): Payer: Self-pay

## 2024-09-14 MED ORDER — TIZANIDINE HCL 4 MG PO TABS
2.0000 mg | ORAL_TABLET | Freq: Every evening | ORAL | 1 refills | Status: AC | PRN
  Filled 2024-09-14: qty 90, 90d supply, fill #0

## 2024-09-15 ENCOUNTER — Other Ambulatory Visit (HOSPITAL_BASED_OUTPATIENT_CLINIC_OR_DEPARTMENT_OTHER): Payer: Self-pay

## 2024-09-15 MED ORDER — TRAMADOL HCL 50 MG PO TABS
50.0000 mg | ORAL_TABLET | Freq: Two times a day (BID) | ORAL | 2 refills | Status: AC | PRN
  Filled 2024-09-15: qty 180, 90d supply, fill #0

## 2024-10-12 ENCOUNTER — Other Ambulatory Visit (HOSPITAL_BASED_OUTPATIENT_CLINIC_OR_DEPARTMENT_OTHER): Payer: Self-pay

## 2024-10-18 ENCOUNTER — Other Ambulatory Visit (HOSPITAL_BASED_OUTPATIENT_CLINIC_OR_DEPARTMENT_OTHER): Payer: Self-pay

## 2024-10-19 ENCOUNTER — Other Ambulatory Visit: Payer: Self-pay

## 2024-10-19 ENCOUNTER — Other Ambulatory Visit (HOSPITAL_BASED_OUTPATIENT_CLINIC_OR_DEPARTMENT_OTHER): Payer: Self-pay

## 2024-10-19 DIAGNOSIS — H524 Presbyopia: Secondary | ICD-10-CM | POA: Diagnosis not present

## 2024-10-19 MED ORDER — CLOPIDOGREL BISULFATE 75 MG PO TABS
75.0000 mg | ORAL_TABLET | Freq: Every day | ORAL | 3 refills | Status: AC
  Filled 2024-10-19: qty 90, 90d supply, fill #0
  Filled 2025-01-12: qty 90, 90d supply, fill #1

## 2024-10-20 ENCOUNTER — Other Ambulatory Visit (HOSPITAL_BASED_OUTPATIENT_CLINIC_OR_DEPARTMENT_OTHER): Payer: Self-pay

## 2024-11-10 DIAGNOSIS — M65342 Trigger finger, left ring finger: Secondary | ICD-10-CM | POA: Diagnosis not present

## 2024-11-10 DIAGNOSIS — M6530 Trigger finger, unspecified finger: Secondary | ICD-10-CM | POA: Diagnosis not present

## 2024-11-16 DIAGNOSIS — Z0000 Encounter for general adult medical examination without abnormal findings: Secondary | ICD-10-CM | POA: Diagnosis not present

## 2024-11-16 DIAGNOSIS — Z125 Encounter for screening for malignant neoplasm of prostate: Secondary | ICD-10-CM | POA: Diagnosis not present

## 2024-11-23 ENCOUNTER — Other Ambulatory Visit (HOSPITAL_BASED_OUTPATIENT_CLINIC_OR_DEPARTMENT_OTHER): Payer: Self-pay

## 2024-12-02 DIAGNOSIS — G4733 Obstructive sleep apnea (adult) (pediatric): Secondary | ICD-10-CM | POA: Diagnosis not present

## 2024-12-08 ENCOUNTER — Other Ambulatory Visit (HOSPITAL_BASED_OUTPATIENT_CLINIC_OR_DEPARTMENT_OTHER): Payer: Self-pay

## 2024-12-08 DIAGNOSIS — R7401 Elevation of levels of liver transaminase levels: Secondary | ICD-10-CM | POA: Diagnosis not present

## 2024-12-08 DIAGNOSIS — Z6831 Body mass index (BMI) 31.0-31.9, adult: Secondary | ICD-10-CM | POA: Diagnosis not present

## 2024-12-08 DIAGNOSIS — I10 Essential (primary) hypertension: Secondary | ICD-10-CM | POA: Diagnosis not present

## 2024-12-08 DIAGNOSIS — Z0000 Encounter for general adult medical examination without abnormal findings: Secondary | ICD-10-CM | POA: Diagnosis not present

## 2024-12-08 DIAGNOSIS — E291 Testicular hypofunction: Secondary | ICD-10-CM | POA: Diagnosis not present

## 2024-12-08 DIAGNOSIS — Z1339 Encounter for screening examination for other mental health and behavioral disorders: Secondary | ICD-10-CM | POA: Diagnosis not present

## 2024-12-08 MED ORDER — LISINOPRIL 10 MG PO TABS
10.0000 mg | ORAL_TABLET | Freq: Every day | ORAL | 3 refills | Status: AC
  Filled 2024-12-08: qty 90, 90d supply, fill #0

## 2024-12-14 DIAGNOSIS — R7401 Elevation of levels of liver transaminase levels: Secondary | ICD-10-CM | POA: Diagnosis not present

## 2025-01-06 ENCOUNTER — Other Ambulatory Visit (HOSPITAL_BASED_OUTPATIENT_CLINIC_OR_DEPARTMENT_OTHER): Payer: Self-pay

## 2025-01-12 ENCOUNTER — Other Ambulatory Visit (HOSPITAL_COMMUNITY): Payer: Self-pay

## 2025-01-12 ENCOUNTER — Other Ambulatory Visit: Payer: Self-pay

## 2025-01-12 ENCOUNTER — Other Ambulatory Visit (HOSPITAL_BASED_OUTPATIENT_CLINIC_OR_DEPARTMENT_OTHER): Payer: Self-pay

## 2025-01-12 MED ORDER — CELECOXIB 200 MG PO CAPS
200.0000 mg | ORAL_CAPSULE | Freq: Two times a day (BID) | ORAL | 2 refills | Status: AC
  Filled 2025-01-12: qty 180, 90d supply, fill #0

## 2025-01-12 MED ORDER — CELECOXIB 200 MG PO CAPS: 200.0000 mg | ORAL_CAPSULE | Freq: Two times a day (BID) | ORAL | 2 refills | Status: AC

## 2025-01-13 ENCOUNTER — Other Ambulatory Visit (HOSPITAL_BASED_OUTPATIENT_CLINIC_OR_DEPARTMENT_OTHER): Payer: Self-pay

## 2025-01-13 ENCOUNTER — Other Ambulatory Visit (HOSPITAL_COMMUNITY): Payer: Self-pay

## 2025-01-13 ENCOUNTER — Other Ambulatory Visit: Payer: Self-pay

## 2025-01-13 MED ORDER — TESTOSTERONE 1.62 % TD GEL
TRANSDERMAL | 2 refills | Status: AC
  Filled 2025-01-13: qty 225, 45d supply, fill #0

## 2025-01-14 ENCOUNTER — Other Ambulatory Visit (HOSPITAL_BASED_OUTPATIENT_CLINIC_OR_DEPARTMENT_OTHER): Payer: Self-pay

## 2025-01-14 MED ORDER — TESTOSTERONE 1.62 % TD GEL
4.0000 | Freq: Every day | TRANSDERMAL | 2 refills | Status: AC
  Filled 2025-01-14: qty 375, 75d supply, fill #0

## 2025-01-15 ENCOUNTER — Other Ambulatory Visit (HOSPITAL_BASED_OUTPATIENT_CLINIC_OR_DEPARTMENT_OTHER): Payer: Self-pay
